# Patient Record
Sex: Female | Born: 1960 | Race: White | Hispanic: No | Marital: Married | Smoking: Current some day smoker
Health system: Southern US, Community
[De-identification: ages and names within clinical notes are randomized; demographics above are authoritative.]

## PROBLEM LIST (undated history)

## (undated) DIAGNOSIS — C801 Malignant (primary) neoplasm, unspecified: Secondary | ICD-10-CM

## (undated) DIAGNOSIS — I10 Essential (primary) hypertension: Secondary | ICD-10-CM

## (undated) HISTORY — PX: LOBECTOMY: SHX5089

---

## 2004-10-04 ENCOUNTER — Ambulatory Visit: Payer: Self-pay | Admitting: Physician Assistant

## 2004-11-01 ENCOUNTER — Ambulatory Visit: Payer: Self-pay | Admitting: Physician Assistant

## 2004-11-02 ENCOUNTER — Encounter: Payer: Self-pay | Admitting: Pain Medicine

## 2004-11-29 ENCOUNTER — Ambulatory Visit: Payer: Self-pay | Admitting: Physician Assistant

## 2005-01-01 ENCOUNTER — Ambulatory Visit: Payer: Self-pay | Admitting: Physician Assistant

## 2005-02-05 ENCOUNTER — Ambulatory Visit: Payer: Self-pay | Admitting: Physician Assistant

## 2005-03-05 ENCOUNTER — Ambulatory Visit: Payer: Self-pay | Admitting: Physician Assistant

## 2005-04-03 ENCOUNTER — Ambulatory Visit: Payer: Self-pay | Admitting: Physician Assistant

## 2005-05-01 ENCOUNTER — Ambulatory Visit: Payer: Self-pay | Admitting: Physician Assistant

## 2005-06-01 ENCOUNTER — Ambulatory Visit: Payer: Self-pay | Admitting: Physician Assistant

## 2005-06-29 ENCOUNTER — Ambulatory Visit: Payer: Self-pay | Admitting: Physician Assistant

## 2005-07-27 ENCOUNTER — Ambulatory Visit: Payer: Self-pay | Admitting: Physician Assistant

## 2005-08-14 ENCOUNTER — Ambulatory Visit: Payer: Self-pay | Admitting: Physician Assistant

## 2005-09-04 ENCOUNTER — Ambulatory Visit: Payer: Self-pay | Admitting: Physician Assistant

## 2005-10-03 ENCOUNTER — Ambulatory Visit: Payer: Self-pay | Admitting: Physician Assistant

## 2005-10-16 ENCOUNTER — Ambulatory Visit: Payer: Self-pay | Admitting: Physician Assistant

## 2005-10-23 ENCOUNTER — Ambulatory Visit: Payer: Self-pay | Admitting: Pain Medicine

## 2005-10-25 ENCOUNTER — Encounter: Payer: Self-pay | Admitting: Pain Medicine

## 2005-11-16 ENCOUNTER — Encounter: Payer: Self-pay | Admitting: Pain Medicine

## 2005-11-22 ENCOUNTER — Ambulatory Visit: Payer: Self-pay | Admitting: Physician Assistant

## 2005-12-31 ENCOUNTER — Ambulatory Visit: Payer: Self-pay | Admitting: Physician Assistant

## 2006-01-15 ENCOUNTER — Ambulatory Visit: Payer: Self-pay | Admitting: Pain Medicine

## 2006-01-29 ENCOUNTER — Ambulatory Visit: Payer: Self-pay | Admitting: Physician Assistant

## 2006-02-28 ENCOUNTER — Ambulatory Visit: Payer: Self-pay | Admitting: Physician Assistant

## 2006-03-26 ENCOUNTER — Ambulatory Visit: Payer: Self-pay | Admitting: Physician Assistant

## 2006-03-29 ENCOUNTER — Encounter: Admission: RE | Admit: 2006-03-29 | Discharge: 2006-03-29 | Payer: Self-pay | Admitting: Pain Medicine

## 2006-05-01 ENCOUNTER — Ambulatory Visit: Payer: Self-pay | Admitting: Physician Assistant

## 2006-05-29 ENCOUNTER — Ambulatory Visit: Payer: Self-pay | Admitting: Physician Assistant

## 2006-07-03 ENCOUNTER — Ambulatory Visit: Payer: Self-pay | Admitting: Physician Assistant

## 2006-07-16 ENCOUNTER — Ambulatory Visit: Payer: Self-pay | Admitting: Pain Medicine

## 2006-07-30 ENCOUNTER — Ambulatory Visit: Payer: Self-pay | Admitting: Physician Assistant

## 2006-08-27 ENCOUNTER — Ambulatory Visit: Payer: Self-pay | Admitting: Physician Assistant

## 2006-09-24 ENCOUNTER — Ambulatory Visit: Payer: Self-pay | Admitting: Physician Assistant

## 2006-10-24 ENCOUNTER — Ambulatory Visit: Payer: Self-pay | Admitting: Physician Assistant

## 2006-11-28 ENCOUNTER — Ambulatory Visit: Payer: Self-pay | Admitting: Physician Assistant

## 2006-12-26 ENCOUNTER — Ambulatory Visit: Payer: Self-pay | Admitting: Physician Assistant

## 2006-12-27 ENCOUNTER — Ambulatory Visit: Payer: Self-pay | Admitting: Internal Medicine

## 2007-01-28 ENCOUNTER — Ambulatory Visit: Payer: Self-pay | Admitting: Physician Assistant

## 2007-02-26 ENCOUNTER — Ambulatory Visit: Payer: Self-pay | Admitting: Physician Assistant

## 2007-03-27 ENCOUNTER — Ambulatory Visit: Payer: Self-pay | Admitting: Physician Assistant

## 2007-05-01 ENCOUNTER — Ambulatory Visit: Payer: Self-pay | Admitting: Physician Assistant

## 2007-06-03 ENCOUNTER — Ambulatory Visit: Payer: Self-pay | Admitting: Physician Assistant

## 2007-07-01 ENCOUNTER — Ambulatory Visit: Payer: Self-pay | Admitting: Physician Assistant

## 2007-07-01 ENCOUNTER — Ambulatory Visit: Payer: Self-pay | Admitting: Internal Medicine

## 2007-07-29 ENCOUNTER — Ambulatory Visit: Payer: Self-pay | Admitting: Physician Assistant

## 2007-09-01 ENCOUNTER — Ambulatory Visit: Payer: Self-pay | Admitting: Physician Assistant

## 2007-10-01 ENCOUNTER — Ambulatory Visit: Payer: Self-pay | Admitting: Physician Assistant

## 2007-10-29 ENCOUNTER — Ambulatory Visit: Payer: Self-pay | Admitting: Physician Assistant

## 2008-02-03 ENCOUNTER — Ambulatory Visit: Payer: Self-pay | Admitting: Physician Assistant

## 2008-04-28 ENCOUNTER — Ambulatory Visit: Payer: Self-pay | Admitting: Physician Assistant

## 2008-06-02 ENCOUNTER — Ambulatory Visit: Payer: Self-pay | Admitting: Physician Assistant

## 2008-07-13 ENCOUNTER — Ambulatory Visit: Payer: Self-pay | Admitting: Unknown Physician Specialty

## 2008-08-31 ENCOUNTER — Ambulatory Visit: Payer: Self-pay | Admitting: Physician Assistant

## 2008-12-06 ENCOUNTER — Ambulatory Visit: Payer: Self-pay | Admitting: Physician Assistant

## 2009-02-23 ENCOUNTER — Ambulatory Visit: Payer: Self-pay | Admitting: Internal Medicine

## 2009-03-01 ENCOUNTER — Ambulatory Visit: Payer: Self-pay | Admitting: Physician Assistant

## 2009-06-02 ENCOUNTER — Ambulatory Visit: Payer: Self-pay | Admitting: Physician Assistant

## 2009-09-01 ENCOUNTER — Ambulatory Visit: Payer: Self-pay | Admitting: Physician Assistant

## 2009-12-06 ENCOUNTER — Ambulatory Visit: Payer: Self-pay | Admitting: Physician Assistant

## 2010-02-27 ENCOUNTER — Ambulatory Visit: Payer: Self-pay | Admitting: Pain Medicine

## 2010-10-05 ENCOUNTER — Ambulatory Visit: Payer: Self-pay | Admitting: Family Medicine

## 2010-10-17 ENCOUNTER — Ambulatory Visit: Payer: Self-pay | Admitting: Family Medicine

## 2011-03-28 ENCOUNTER — Ambulatory Visit: Payer: Self-pay | Admitting: Family Medicine

## 2013-05-22 ENCOUNTER — Ambulatory Visit: Payer: Self-pay | Admitting: Family Medicine

## 2014-05-18 ENCOUNTER — Encounter: Payer: Self-pay | Admitting: *Deleted

## 2014-06-03 ENCOUNTER — Ambulatory Visit: Payer: Self-pay | Admitting: Family Medicine

## 2014-06-07 ENCOUNTER — Ambulatory Visit: Payer: Self-pay | Admitting: General Surgery

## 2014-06-24 ENCOUNTER — Encounter: Payer: Self-pay | Admitting: *Deleted

## 2015-05-23 ENCOUNTER — Ambulatory Visit: Payer: BLUE CROSS/BLUE SHIELD

## 2015-05-23 ENCOUNTER — Ambulatory Visit (INDEPENDENT_AMBULATORY_CARE_PROVIDER_SITE_OTHER): Payer: BLUE CROSS/BLUE SHIELD | Admitting: Podiatry

## 2015-05-23 ENCOUNTER — Ambulatory Visit (INDEPENDENT_AMBULATORY_CARE_PROVIDER_SITE_OTHER): Payer: BLUE CROSS/BLUE SHIELD

## 2015-05-23 ENCOUNTER — Encounter: Payer: Self-pay | Admitting: Podiatry

## 2015-05-23 VITALS — BP 154/93 | HR 68 | Resp 16 | Ht 66.0 in | Wt 226.0 lb

## 2015-05-23 DIAGNOSIS — G629 Polyneuropathy, unspecified: Secondary | ICD-10-CM

## 2015-05-23 DIAGNOSIS — M79671 Pain in right foot: Secondary | ICD-10-CM

## 2015-05-23 DIAGNOSIS — M722 Plantar fascial fibromatosis: Secondary | ICD-10-CM

## 2015-05-23 DIAGNOSIS — M79672 Pain in left foot: Secondary | ICD-10-CM | POA: Diagnosis not present

## 2015-05-23 NOTE — Progress Notes (Signed)
   Subjective:    Patient ID: Michele Dodson, female    DOB: Oct 13, 1961, 54 y.o.   MRN: [id number] Left pt has knot in arch of foot , burning and discomfort. This has been going on for about 8 months. No treatment other than soaking.  For her right footvthere is  burning on the ball of her foot and shoot pains. This has been going on for about 8 months as well.  Pt is not diabetic.  She states that her vitamin D and B-12 are very low. She is currently on replacement therapy for that. She also relates back problems for which she has been seeing a Restaurant manager, fast food. HPI    Review of Systems  Constitutional: Positive for activity change, fatigue and unexpected weight change.       Sweating   Gastrointestinal: Positive for diarrhea.  Endocrine: Positive for heat intolerance.  Musculoskeletal: Positive for back pain.       Muscle pain   Hematological: Bruises/bleeds easily.       Swollen lymph nodes   All other systems reviewed and are negative.      Objective:   Physical Exam  : I have reviewed her past medical history medications allergies surgery social history and review of systems. Pulses remain intact bilateral. Neurologic sensorium is intact per Semmes-Weinstein monofilament. Tendon reflexes are intact bilateral. Muscle strength intrinsic and extrinsic are intact. Orthopedic evaluation was resulted was distal to the ankle for range of motion without crepitation. Cutaneous evaluation of straight supple well-hydrated cutis no erythema edema saline drainage or odor.  Plantar fibroma is noted to the left forefoot. It measures approximately 2 cm x 1 cm half a centimeter.       Assessment & Plan:   assessment: E pathic neuropathy.  Plantar fibromatosis left.   Plan: discussed etiology pathology conservative versus surgical therapies.  At this point she's going to continue all other therapies follow-up with a chest x-ray as well as her medications.

## 2015-06-15 ENCOUNTER — Ambulatory Visit
Admission: RE | Admit: 2015-06-15 | Discharge: 2015-06-15 | Disposition: A | Discharge status: Home / Self Care | Payer: BLUE CROSS/BLUE SHIELD | Source: Ambulatory Visit | Attending: Family Medicine | Admitting: Family Medicine

## 2015-06-15 ENCOUNTER — Other Ambulatory Visit: Payer: Self-pay | Admitting: Family Medicine

## 2015-06-15 DIAGNOSIS — R059 Cough, unspecified: Secondary | ICD-10-CM

## 2015-06-15 DIAGNOSIS — R05 Cough: Secondary | ICD-10-CM

## 2015-10-11 DIAGNOSIS — Z87898 Personal history of other specified conditions: Secondary | ICD-10-CM | POA: Insufficient documentation

## 2015-10-11 DIAGNOSIS — M545 Low back pain, unspecified: Secondary | ICD-10-CM | POA: Insufficient documentation

## 2015-10-11 DIAGNOSIS — G8929 Other chronic pain: Secondary | ICD-10-CM | POA: Insufficient documentation

## 2015-10-11 DIAGNOSIS — F1191 Opioid use, unspecified, in remission: Secondary | ICD-10-CM | POA: Insufficient documentation

## 2015-10-19 ENCOUNTER — Other Ambulatory Visit: Payer: Self-pay | Admitting: Family Medicine

## 2015-10-19 DIAGNOSIS — Z1231 Encounter for screening mammogram for malignant neoplasm of breast: Secondary | ICD-10-CM

## 2015-10-24 ENCOUNTER — Other Ambulatory Visit: Payer: Self-pay | Admitting: Family Medicine

## 2015-10-24 DIAGNOSIS — R221 Localized swelling, mass and lump, neck: Secondary | ICD-10-CM

## 2015-10-31 ENCOUNTER — Ambulatory Visit: Payer: BLUE CROSS/BLUE SHIELD

## 2015-11-01 ENCOUNTER — Ambulatory Visit: Payer: BLUE CROSS/BLUE SHIELD | Attending: Family Medicine

## 2016-01-03 IMAGING — CR DG CHEST 2V
1 series · 2 of 2 positions shown · non-contrast
Comparison: None.

CLINICAL DATA: Coughing and congestion.

EXAM:
CHEST  2 VIEW

[Series 1: dg chest 2 view · 0.14mm/px · 2 of 2 slices shown]
[im 1/2]
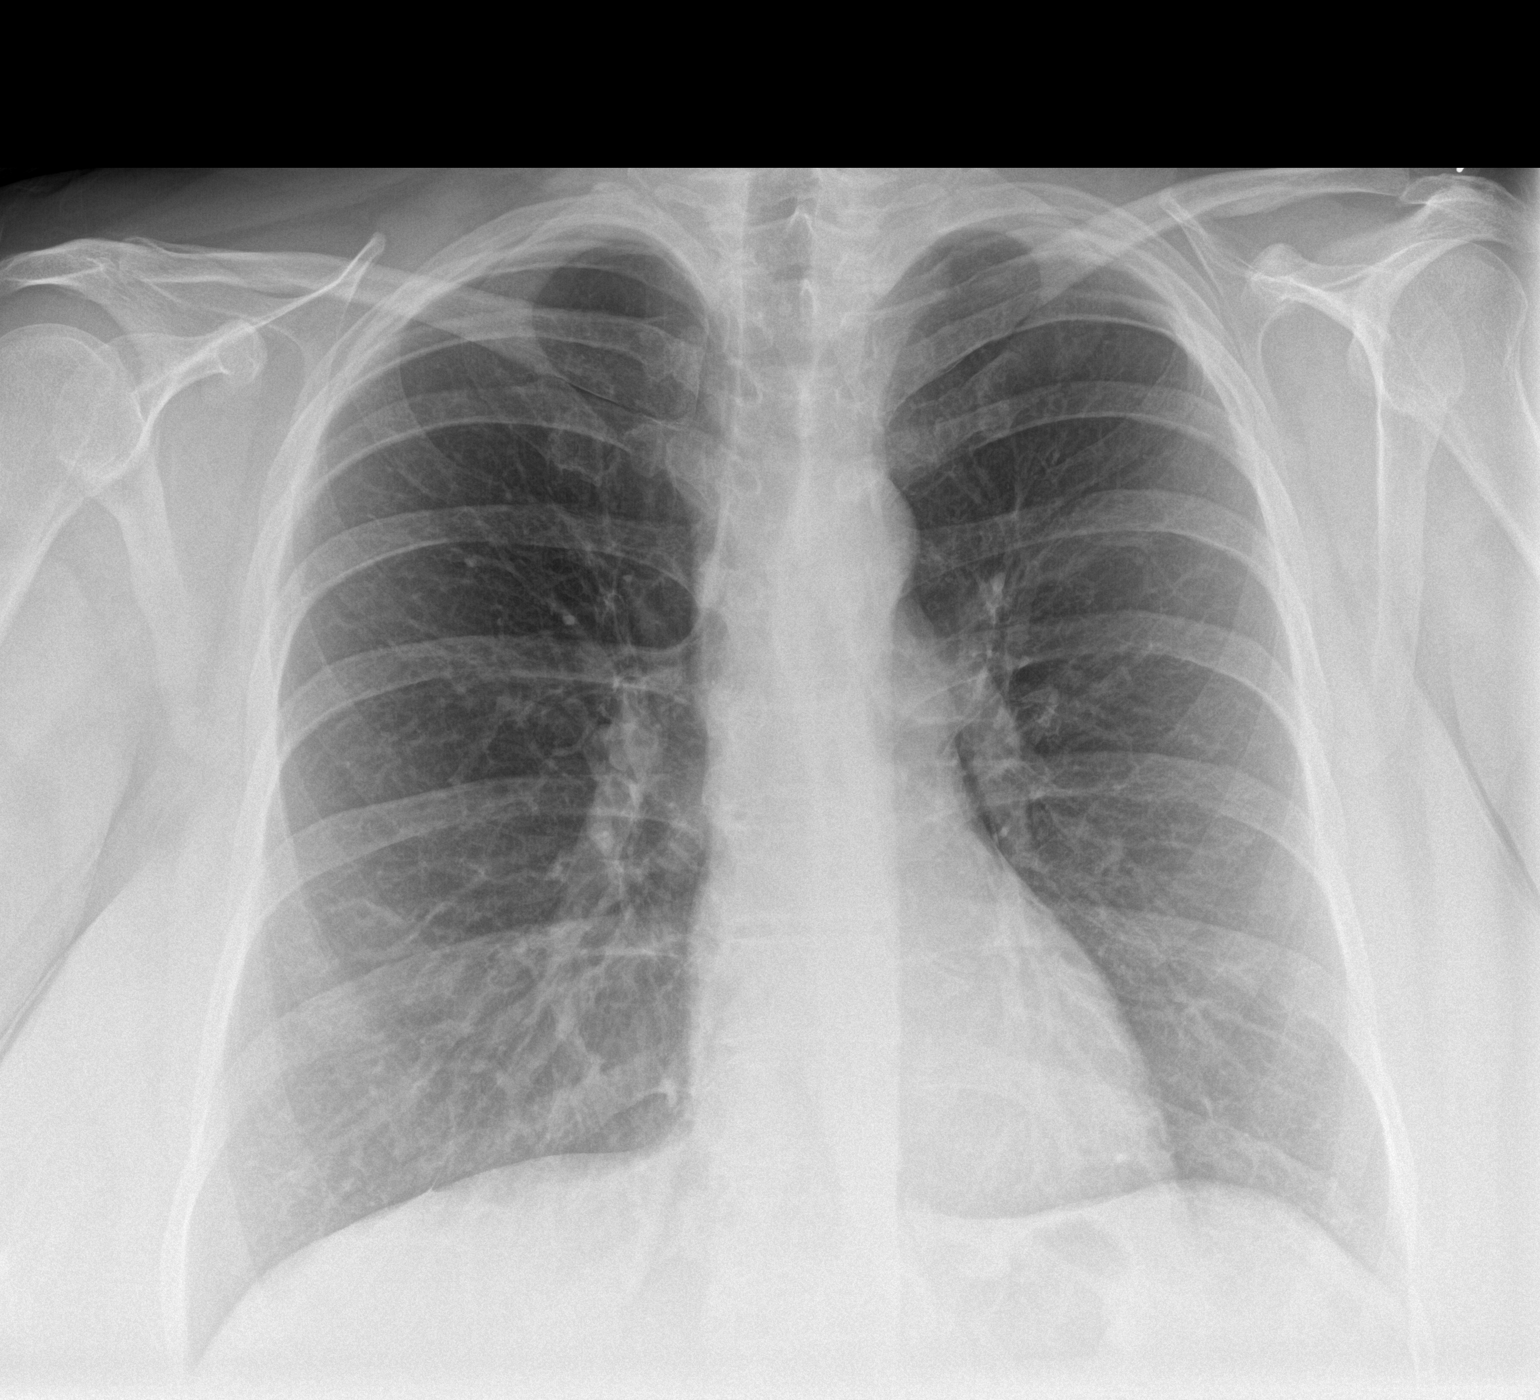
[im 2/2]
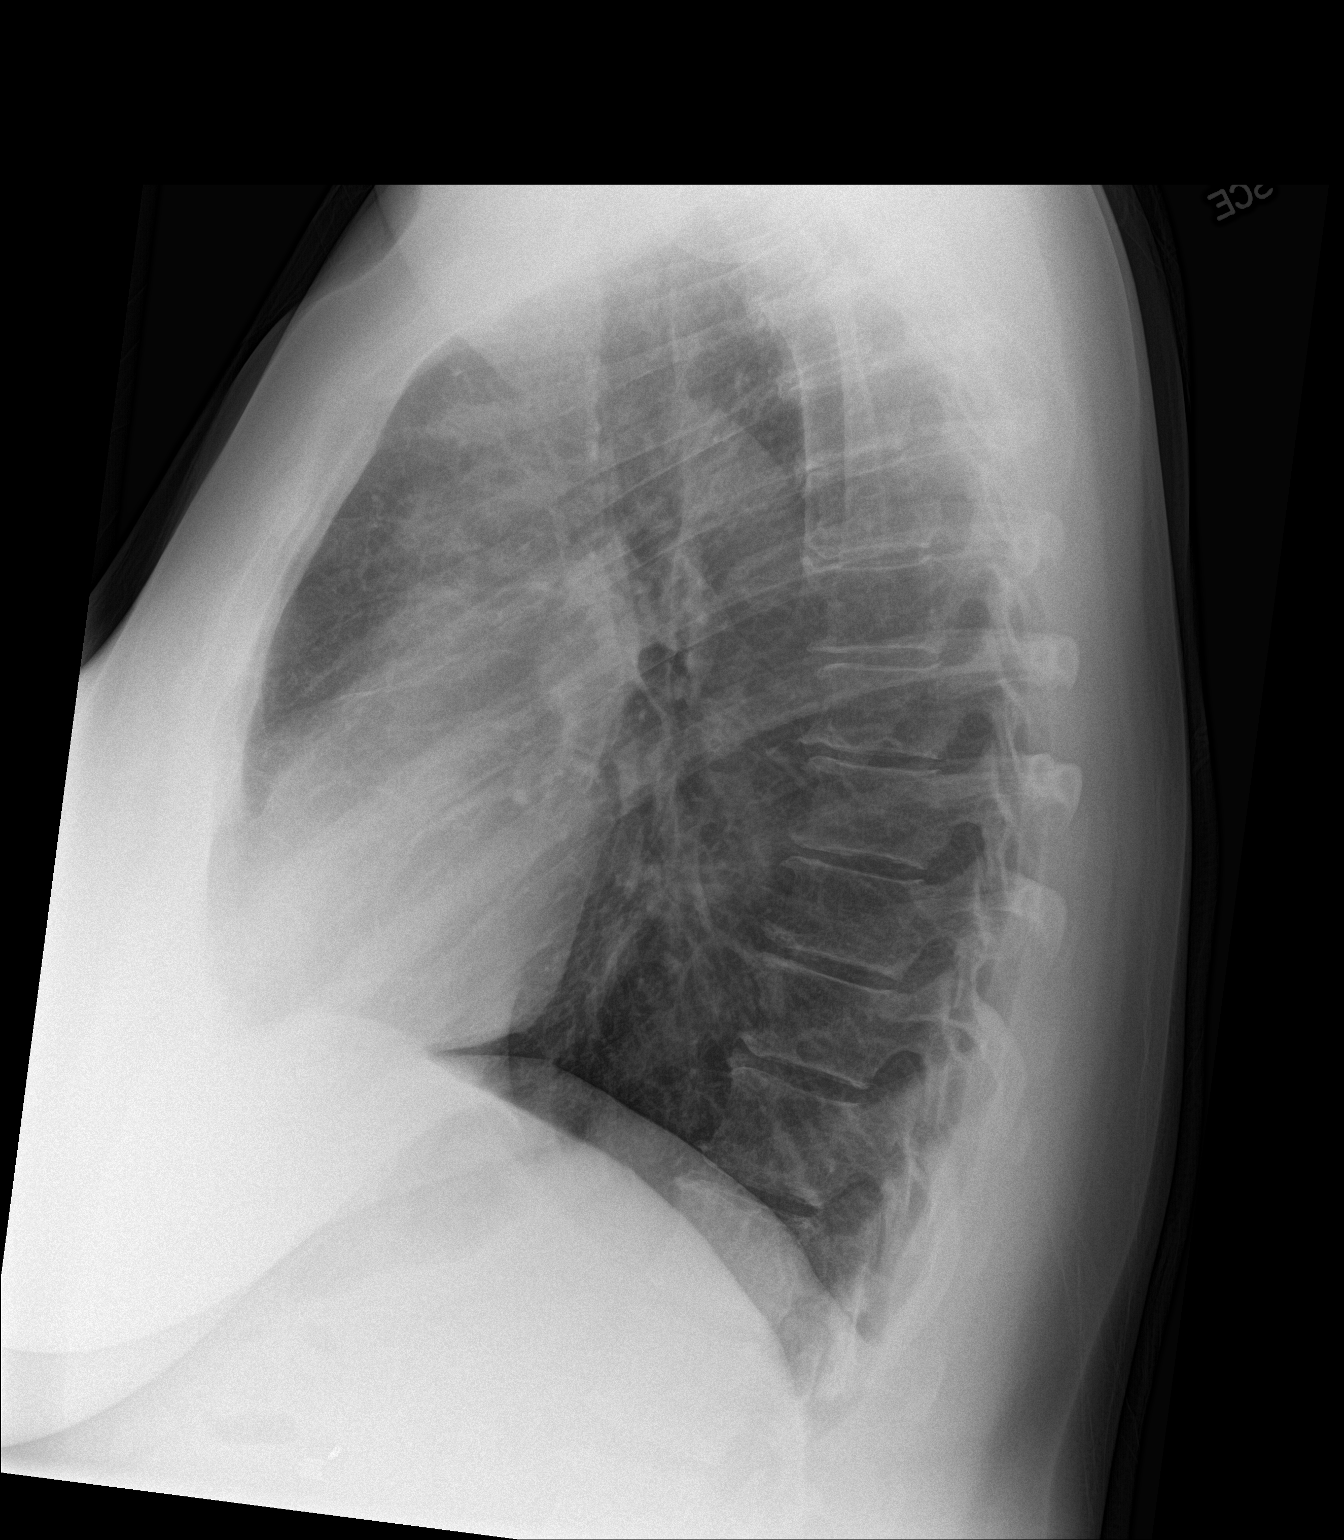

[2 of 2 positions shown; findings below may reference images not displayed]

FINDINGS: Both lungs are clear. Heart size is normal. Trachea is midline. Mild
degenerative endplate changes in the thoracic spine. No large
pleural effusions.
IMPRESSION: No active cardiopulmonary disease.

## 2016-02-03 ENCOUNTER — Institutional Professional Consult (permissible substitution): Payer: BLUE CROSS/BLUE SHIELD | Admitting: Internal Medicine

## 2016-09-03 ENCOUNTER — Ambulatory Visit: Payer: Self-pay | Admitting: Family Medicine

## 2016-09-20 DIAGNOSIS — K219 Gastro-esophageal reflux disease without esophagitis: Secondary | ICD-10-CM | POA: Insufficient documentation

## 2017-07-17 DIAGNOSIS — C801 Malignant (primary) neoplasm, unspecified: Secondary | ICD-10-CM

## 2017-07-17 HISTORY — DX: Malignant (primary) neoplasm, unspecified: C80.1

## 2017-07-25 DIAGNOSIS — Z6841 Body Mass Index (BMI) 40.0 and over, adult: Secondary | ICD-10-CM | POA: Insufficient documentation

## 2017-08-07 DIAGNOSIS — R0602 Shortness of breath: Secondary | ICD-10-CM | POA: Insufficient documentation

## 2017-08-26 DIAGNOSIS — C9110 Chronic lymphocytic leukemia of B-cell type not having achieved remission: Secondary | ICD-10-CM | POA: Insufficient documentation

## 2017-09-04 ENCOUNTER — Other Ambulatory Visit: Payer: Self-pay | Admitting: Family Medicine

## 2017-09-04 DIAGNOSIS — Z1231 Encounter for screening mammogram for malignant neoplasm of breast: Secondary | ICD-10-CM

## 2017-09-11 ENCOUNTER — Ambulatory Visit (INDEPENDENT_AMBULATORY_CARE_PROVIDER_SITE_OTHER): Payer: BLUE CROSS/BLUE SHIELD | Admitting: Podiatry

## 2017-09-11 ENCOUNTER — Ambulatory Visit: Payer: BLUE CROSS/BLUE SHIELD

## 2017-09-11 DIAGNOSIS — M7750 Other enthesopathy of unspecified foot: Secondary | ICD-10-CM

## 2017-09-11 DIAGNOSIS — I872 Venous insufficiency (chronic) (peripheral): Secondary | ICD-10-CM | POA: Diagnosis not present

## 2017-09-11 NOTE — Progress Notes (Signed)
She presents today with a chief complaint of bilateral ankle swelling and pain in the anterolateral ankle and heel. She reports that she's recently been diagnosed with leukemia and says that her ankle swell intermittently she states that it is a pulling pain in that she hasn't had any treatment for this.  Objective: Vital signs are stable alert and oriented 3. Pulses are strongly palpable capillary fill times immediate to toes. She has some pitting edema. She has pain on mediolateral compression.  assessment: venous insufficiency.  plan: i'm requesting venous workup from vascular surgery.

## 2017-09-12 ENCOUNTER — Telehealth: Payer: Self-pay

## 2017-09-12 NOTE — Telephone Encounter (Signed)
Patient notified via voice mail of appt scheduled at [facility] on Thurs. 10/17/17 @ 1pm.  She will be placed on cancellation list.  No precert required for bilat venous doppler per Melony Overly. With [employer]

## 2017-09-12 NOTE — Addendum Note (Signed)
Addended by: Graceann Congress D on: 09/12/2017 02:23 PM   Modules accepted: Orders

## 2017-09-17 ENCOUNTER — Ambulatory Visit
Admission: RE | Admit: 2017-09-17 | Discharge: 2017-09-17 | Disposition: A | Discharge status: Home / Self Care | Payer: BLUE CROSS/BLUE SHIELD | Source: Ambulatory Visit | Attending: Family Medicine | Admitting: Family Medicine

## 2017-09-17 DIAGNOSIS — Z1231 Encounter for screening mammogram for malignant neoplasm of breast: Secondary | ICD-10-CM | POA: Diagnosis present

## 2017-09-25 ENCOUNTER — Other Ambulatory Visit: Payer: Self-pay | Admitting: Family Medicine

## 2017-09-25 DIAGNOSIS — N6320 Unspecified lump in the left breast, unspecified quadrant: Secondary | ICD-10-CM

## 2017-09-25 DIAGNOSIS — R928 Other abnormal and inconclusive findings on diagnostic imaging of breast: Secondary | ICD-10-CM

## 2017-09-25 DIAGNOSIS — R599 Enlarged lymph nodes, unspecified: Secondary | ICD-10-CM

## 2017-09-30 ENCOUNTER — Ambulatory Visit
Admission: RE | Admit: 2017-09-30 | Discharge: 2017-09-30 | Disposition: A | Discharge status: Home / Self Care | Payer: BLUE CROSS/BLUE SHIELD | Source: Ambulatory Visit | Attending: Family Medicine | Admitting: Family Medicine

## 2017-09-30 DIAGNOSIS — N6320 Unspecified lump in the left breast, unspecified quadrant: Secondary | ICD-10-CM

## 2017-09-30 DIAGNOSIS — N6321 Unspecified lump in the left breast, upper outer quadrant: Secondary | ICD-10-CM | POA: Diagnosis not present

## 2017-09-30 DIAGNOSIS — R928 Other abnormal and inconclusive findings on diagnostic imaging of breast: Secondary | ICD-10-CM

## 2017-09-30 DIAGNOSIS — R599 Enlarged lymph nodes, unspecified: Secondary | ICD-10-CM

## 2017-10-17 ENCOUNTER — Encounter (INDEPENDENT_AMBULATORY_CARE_PROVIDER_SITE_OTHER): Payer: BLUE CROSS/BLUE SHIELD

## 2017-10-17 ENCOUNTER — Encounter (INDEPENDENT_AMBULATORY_CARE_PROVIDER_SITE_OTHER): Payer: BLUE CROSS/BLUE SHIELD | Admitting: Vascular Surgery

## 2017-11-21 ENCOUNTER — Encounter (INDEPENDENT_AMBULATORY_CARE_PROVIDER_SITE_OTHER): Payer: BLUE CROSS/BLUE SHIELD | Admitting: Vascular Surgery

## 2017-11-21 ENCOUNTER — Encounter (INDEPENDENT_AMBULATORY_CARE_PROVIDER_SITE_OTHER): Payer: BLUE CROSS/BLUE SHIELD

## 2018-02-28 ENCOUNTER — Other Ambulatory Visit: Payer: Self-pay | Admitting: Family Medicine

## 2018-02-28 DIAGNOSIS — R928 Other abnormal and inconclusive findings on diagnostic imaging of breast: Secondary | ICD-10-CM

## 2018-03-12 ENCOUNTER — Other Ambulatory Visit: Payer: BLUE CROSS/BLUE SHIELD

## 2018-03-26 ENCOUNTER — Other Ambulatory Visit: Payer: BLUE CROSS/BLUE SHIELD

## 2018-04-20 IMAGING — MG 2D DIGITAL DIAGNOSTIC BILATERAL MAMMOGRAM WITH CAD AND ADJUNCT T
6 of 9 series · 6 of 21 positions shown · non-contrast
Comparison: Previous exam(s).

CLINICAL DATA: 56-year-old female recalled from screening mammogram
dated 09/17/2017 for a left breast asymmetry and bilateral axillary
lymphadenopathy. Of note, the patient was diagnosed with leukemia in
Monday July, 2017.

EXAM:
2D DIGITAL DIAGNOSTIC LEFT MAMMOGRAM WITH CAD AND ADJUNCT TOMO
ULTRASOUND BILATERAL AXILLA

[L CC]
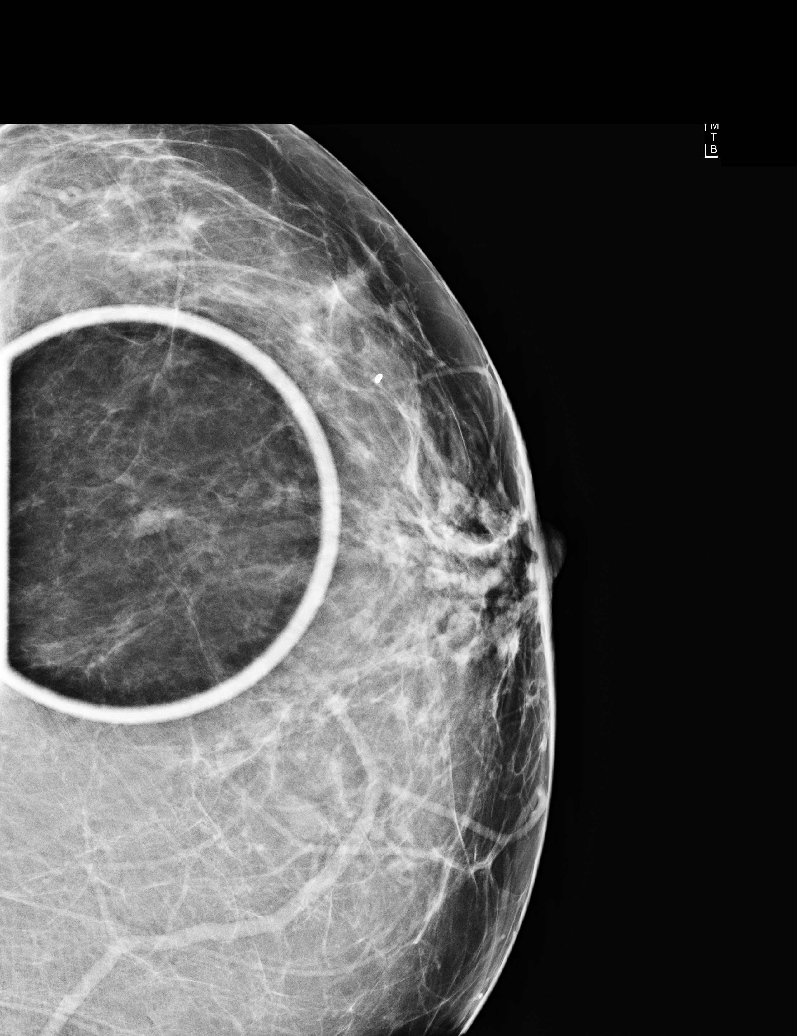

[L CC synth-2D]
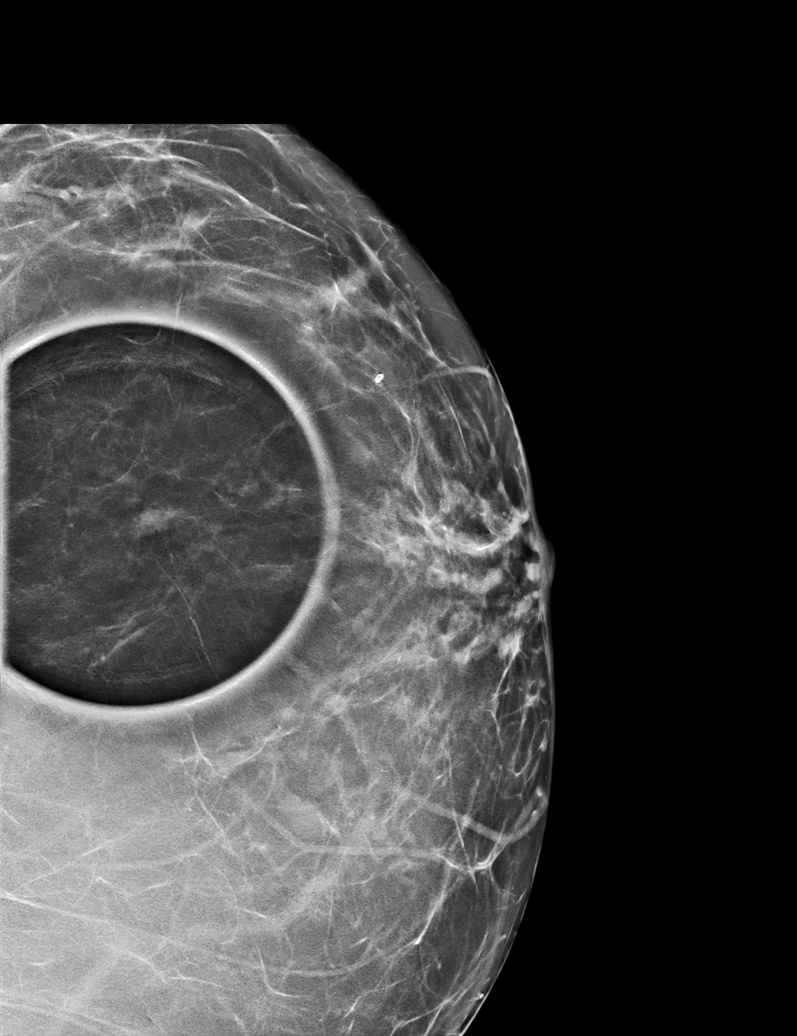

[L ML]
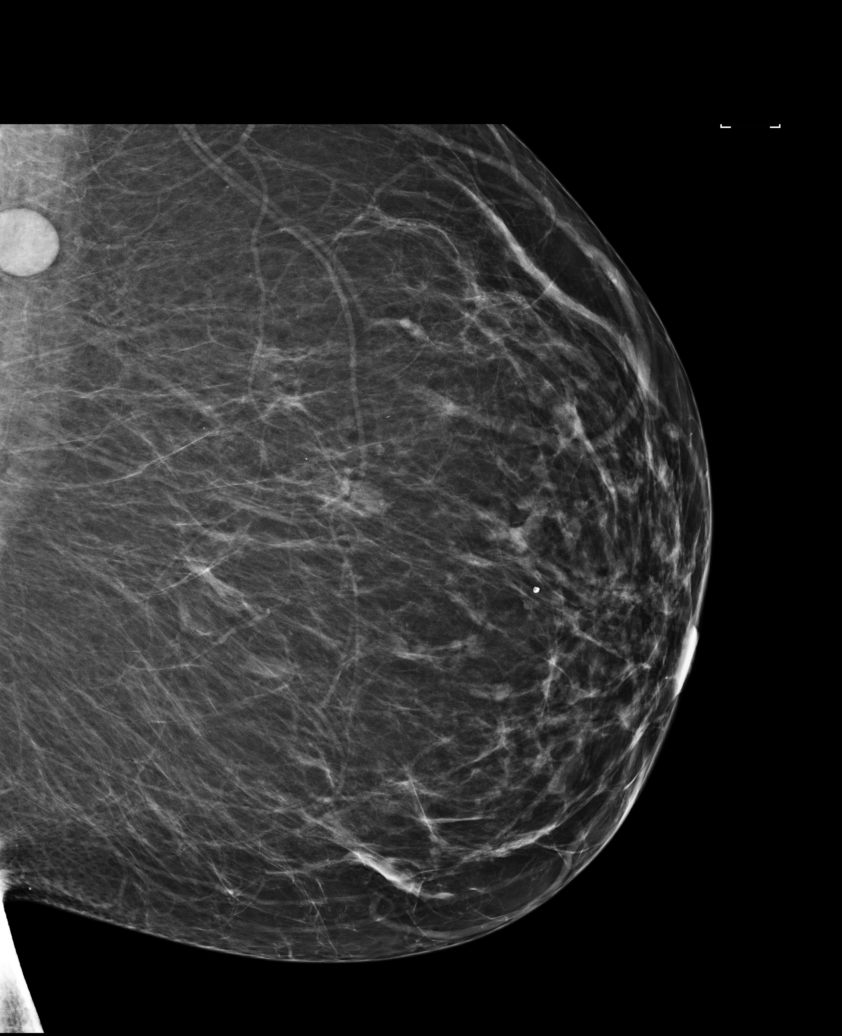

[L MLO synth-2D]
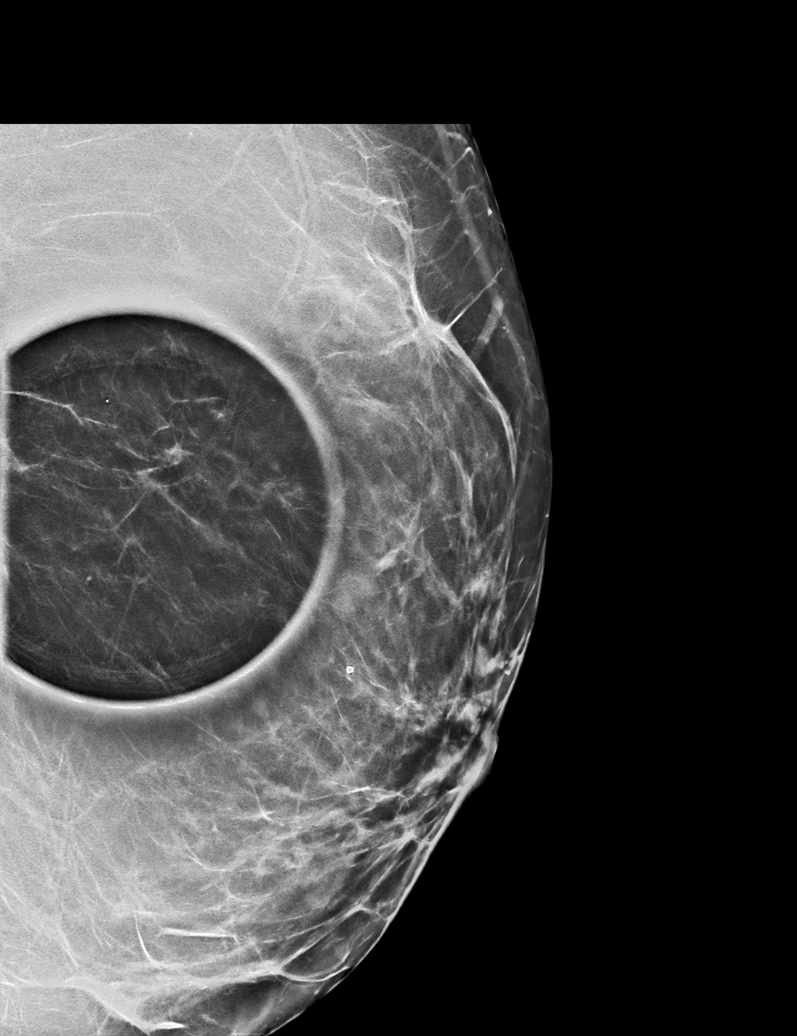

[L ML synth-2D]
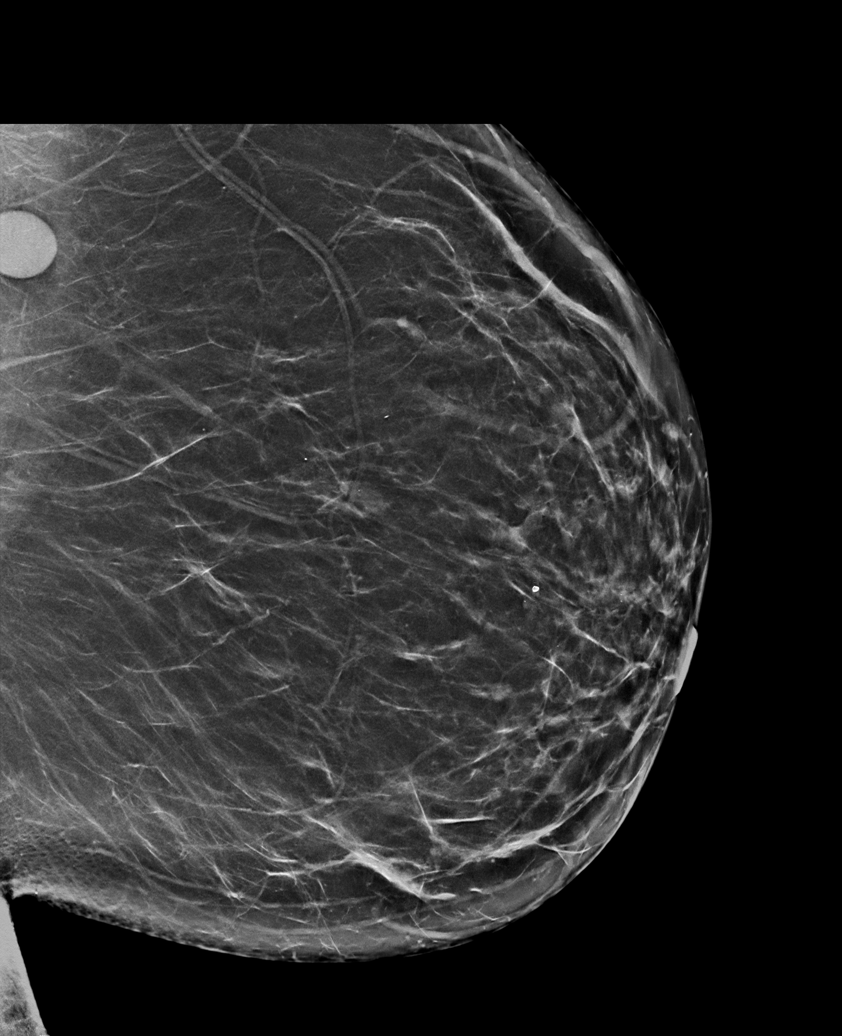

[L MLO]
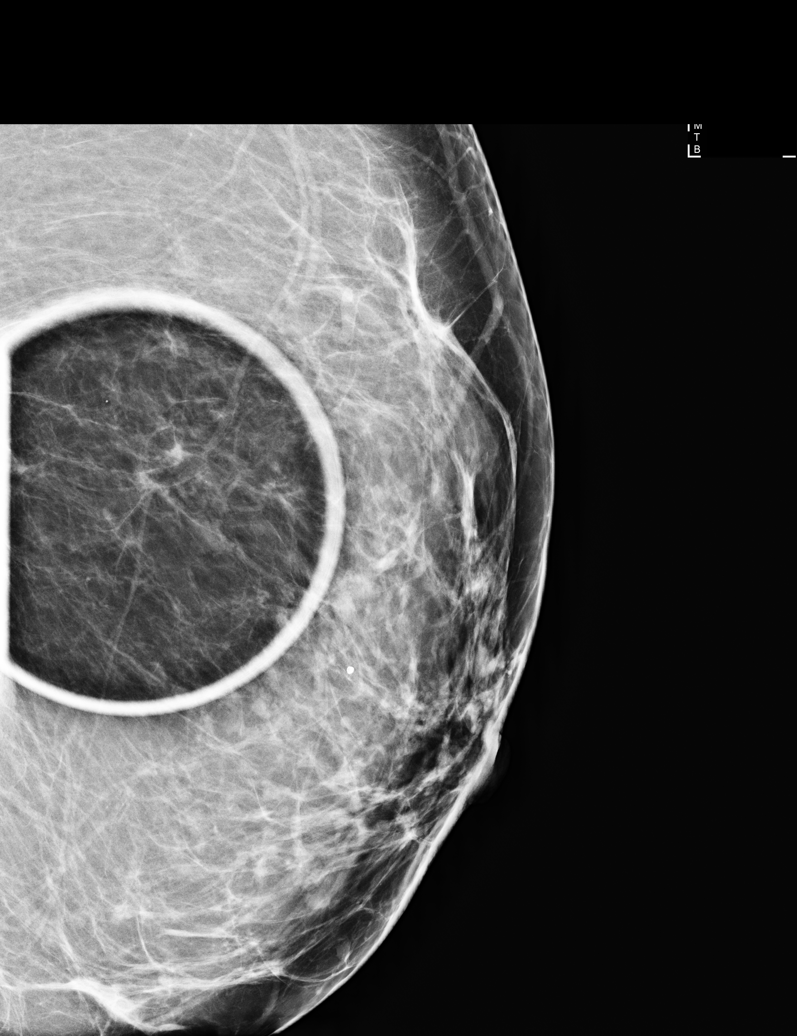

[6 of 21 positions shown; findings below may reference images not displayed]

ACR Breast Density Category b: There are scattered areas of
fibroglandular density.
FINDINGS: An asymmetry in the superior central left breast at middle depth
persists on today's additional views. It appears circumscribed with
lobulated borders and is equal in density. Further evaluation with
ultrasound was performed.

Mammographic images were processed with CAD.

Targeted ultrasound is performed, showing circumscribed lobulated
hypoechoic to anechoic masses at the 12 o'clock position 5 cm from
the nipple and 12 o'clock position 3 cm from the nipple on the left.
They measure 0.7 x 0.5 x 0.2 cm and 0.6 x 0.6 x 0.3 cm respectively.
There is no significant internal vascularity. These findings likely
represent complicated cysts versus cyst clusters. One of them likely
corresponds with the mammographic finding.

Additional evaluation of the bilateral axilla demonstrate multiple,
diffusely enlarged lymph nodes. They demonstrated 0.9 cm of cortical
thickening on the right and 0.8 cm of cortical thickening on the
left.
IMPRESSION: 1. Probably benign left breast masses x2, likely representing
complicated cysts versus cyst clusters. Recommendation is for
six-month mammographic and sonographic follow-up.
2. Bilateral axillary lymphadenopathy, suggesting a systemic process
and most likely related to the patient's recently diagnosed
leukemia. Further evaluation with biopsy can be performed as
clinically indicated.

RECOMMENDATION:
Diagnostic left breast mammogram and ultrasound in 6 months.

I have discussed the findings and recommendations with the patient.
Results were also provided in writing at the conclusion of the
visit. If applicable, a reminder letter will be sent to the patient
regarding the next appointment.

BI-RADS CATEGORY  3: Probably benign.

## 2018-05-09 ENCOUNTER — Ambulatory Visit
Admission: RE | Admit: 2018-05-09 | Discharge: 2018-05-09 | Disposition: A | Discharge status: Home / Self Care | Payer: BLUE CROSS/BLUE SHIELD | Source: Ambulatory Visit | Attending: Family Medicine | Admitting: Family Medicine

## 2018-05-09 DIAGNOSIS — R928 Other abnormal and inconclusive findings on diagnostic imaging of breast: Secondary | ICD-10-CM | POA: Insufficient documentation

## 2018-09-06 ENCOUNTER — Encounter: Payer: Self-pay | Admitting: Behavioral Health

## 2018-09-06 DIAGNOSIS — J302 Other seasonal allergic rhinitis: Secondary | ICD-10-CM | POA: Insufficient documentation

## 2018-09-06 DIAGNOSIS — R7302 Impaired glucose tolerance (oral): Secondary | ICD-10-CM | POA: Insufficient documentation

## 2018-09-26 ENCOUNTER — Ambulatory Visit (INDEPENDENT_AMBULATORY_CARE_PROVIDER_SITE_OTHER): Payer: BLUE CROSS/BLUE SHIELD | Admitting: Physician Assistant

## 2018-09-26 ENCOUNTER — Encounter: Payer: Self-pay | Admitting: Physician Assistant

## 2018-09-26 DIAGNOSIS — F331 Major depressive disorder, recurrent, moderate: Secondary | ICD-10-CM

## 2018-09-26 DIAGNOSIS — F411 Generalized anxiety disorder: Secondary | ICD-10-CM | POA: Diagnosis not present

## 2018-09-26 DIAGNOSIS — R5383 Other fatigue: Secondary | ICD-10-CM

## 2018-09-26 DIAGNOSIS — C9110 Chronic lymphocytic leukemia of B-cell type not having achieved remission: Secondary | ICD-10-CM | POA: Diagnosis not present

## 2018-09-26 DIAGNOSIS — F329 Major depressive disorder, single episode, unspecified: Secondary | ICD-10-CM | POA: Diagnosis not present

## 2018-09-26 DIAGNOSIS — F32A Depression, unspecified: Secondary | ICD-10-CM

## 2018-09-26 MED ORDER — SERTRALINE HCL 100 MG PO TABS: 200.0000 mg | ORAL_TABLET | Freq: Every day | ORAL | 1 refills | Status: DC

## 2018-09-26 MED ORDER — ARIPIPRAZOLE 5 MG PO TABS: 5.0000 mg | ORAL_TABLET | Freq: Every day | ORAL | 1 refills | Status: DC

## 2018-09-26 NOTE — Progress Notes (Signed)
[facility]  Patient ID: Michele Dodson,  MRN: [id number]  PCP: Serita Butcher, FNP  Date of Evaluation: 09/26/2018 Time spent:25 minutes   HISTORY/CURRENT STATUS: HPI admits he has had a lot of sadness since I saw her the last time 3 months ago.  Her dad died on 09-27-2023 of metastatic melanoma.  Although he was diagnosed a couple of years ago, his death happened kind of suddenly.  He had a fall, hitting his head and he died a couple of days later at home.  He had been on hospice for a while. Then her mother was unfortunately diagnosed with melanoma herself last week.  It is a stage III.  The plan is pending. She states she is tired all the time, which is not new, but the modafinil has not helped at all.  We had started that about 2 and half months ago and she still feels very tired, has no energy, does not want to do much of anything, and will fall asleep easily.  She does not feel any more depressed that she was tired to her father's death.  She denies any shortness of breath, chest pain, or dizziness.  She will see the oncologist within the next month, and follow-up for CLL.  She feels like she is having more lymph nodes in her neck and groin. She states that she talked to the pharmacist about the modafinil, telling her that it did not work and the pharmacist recommended she discuss using methylphenidate with me. Patient continues to have generalized anxiety.  She does have panic attacks that are triggered from the grief of the death of her dad.  She when she goes in her parents home, she states that empty chair where he always sat and she can start crying and having shortness of breath.  The panic attacks are relieved with Valium.  individual Medical History/ Review of Systems: Changes? :No   Allergies: Codeine  Current Medications:  Current Outpatient Medications:  .  amLODipine (NORVASC) 10 MG tablet, amlodipine 10 mg tablet  TAKE ONE TABLET BY MOUTH EVERY DAY,  Disp: , Rfl:  .  ARIPiprazole (ABILIFY) 5 MG tablet, Take 1 tablet (5 mg total) by mouth daily after breakfast., Disp: 30 tablet, Rfl: 1 .  buPROPion (WELLBUTRIN XL) 150 MG 24 hr tablet, Take 150 mg by mouth daily. Take 3 po Q Am, Disp: , Rfl:  .  diazepam (VALIUM) 5 MG tablet, Take 5 mg by mouth every 8 (eight) hours as needed for anxiety., Disp: , Rfl:  .  esomeprazole (NEXIUM) 40 MG capsule, Take 40 mg by mouth., Disp: , Rfl:  .  losartan (COZAAR) 100 MG tablet, losartan 100 mg tablet  TAKE ONE TABLET BY MOUTH EVERY DAY, Disp: , Rfl:  .  modafinil (PROVIGIL) 200 MG tablet, Take 200 mg by mouth daily as needed. 1/2 Q am prn, Disp: , Rfl:  .  sertraline (ZOLOFT) 100 MG tablet, Take 100 mg by mouth daily. Take 2 tablets daily, Disp: , Rfl:  .  sertraline (ZOLOFT) 100 MG tablet, Take 2 tablets (200 mg total) by mouth daily., Disp: 180 tablet, Rfl: 1 .  topiramate (TOPAMAX) 50 MG tablet, topiramate 50 mg tablet  Take 1 tablet (50 mg total) by mouth nightly., Disp: , Rfl:  .  traZODone (DESYREL) 100 MG tablet, trazodone 100 mg tablet  TAKE 1 OR 2 TABLETS AT BEDTIME AS NEEDED SLEEP, Disp: , Rfl:  Medication Side Effects: None  Family Medical/  Social History: Changes? Yes Dad died in 10-01-23 from Melanoma, Mom dx last week w/ Melanoma  MENTAL HEALTH EXAM:  There were no vitals taken for this visit.There is no height or weight on file to calculate BMI.  General Appearance: Well Groomed obese  Eye Contact:  Good  Speech:  Clear and Coherent  Volume:  Normal  Mood:  Depressed  Affect:  Depressed  Thought Process:  Goal Directed  Orientation:  Full (Time, Place, and Person)  Thought Content: Logical   Suicidal Thoughts:  No  Homicidal Thoughts:  No  Memory:  Immediate  Judgement:  Good  Insight:  Good  Psychomotor Activity:  Normal  Concentration:  fair  Recall:  Good  Fund of Knowledge: Good  Language: Good  Akathisia:  No  AIMS (if indicated): not done  Assets:  Desire for Improvement   ADL's:  Intact  Cognition: WNL  Prognosis:  Good    DIAGNOSES:    ICD-10-CM   1. Major depressive disorder, recurrent episode, moderate (HCC) F33.1   2. Generalized anxiety disorder F41.1   3. Fatigue due to depression F32.9    R53.83   4. 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      Chronic Low back pain 6.      Recent death of her father from metastatic melanoma   RECOMMENDATIONS: I spent 50% of 30-minute visit in counseling reference diagnosis and treatment options. My condolences concerning the loss of her father. Because she is on a benzodiazepine, and still very much needs that for anxiety, I am not going to prescribe strong stimulant.  She understands the reasoning of 1 began in upper and one being a downer. Add Abilify 5 mg 1 p.o. every morning, in order to increase her energy and motivation that is coming from the depression.  We discussed this at length and agree that a lot of her symptoms are multifactorial and when she sees the oncologist in the next few weeks will have more information as to whether the fatigue is coming from a physiologic cause as well as emotional and mental. Continue Zoloft, Wellbutrin, and Valium as noted above. DC modafinil. Return in 6 weeks or sooner as needed.  Donnal Moat, PA-C

## 2018-10-02 ENCOUNTER — Other Ambulatory Visit: Payer: Self-pay | Admitting: Family Medicine

## 2018-10-02 DIAGNOSIS — R928 Other abnormal and inconclusive findings on diagnostic imaging of breast: Secondary | ICD-10-CM

## 2018-10-02 DIAGNOSIS — Z1231 Encounter for screening mammogram for malignant neoplasm of breast: Secondary | ICD-10-CM

## 2018-10-28 ENCOUNTER — Ambulatory Visit
Admission: RE | Admit: 2018-10-28 | Discharge: 2018-10-28 | Disposition: A | Discharge status: Home / Self Care | Payer: BLUE CROSS/BLUE SHIELD | Source: Ambulatory Visit | Attending: Family Medicine | Admitting: Family Medicine

## 2018-10-28 DIAGNOSIS — R928 Other abnormal and inconclusive findings on diagnostic imaging of breast: Secondary | ICD-10-CM

## 2018-10-28 DIAGNOSIS — Z1231 Encounter for screening mammogram for malignant neoplasm of breast: Secondary | ICD-10-CM | POA: Diagnosis present

## 2018-10-28 HISTORY — DX: Malignant (primary) neoplasm, unspecified: C80.1

## 2018-11-07 ENCOUNTER — Ambulatory Visit (INDEPENDENT_AMBULATORY_CARE_PROVIDER_SITE_OTHER): Payer: BLUE CROSS/BLUE SHIELD | Admitting: Physician Assistant

## 2018-11-07 ENCOUNTER — Encounter: Payer: Self-pay | Admitting: Physician Assistant

## 2018-11-07 DIAGNOSIS — F411 Generalized anxiety disorder: Secondary | ICD-10-CM | POA: Diagnosis not present

## 2018-11-07 DIAGNOSIS — F331 Major depressive disorder, recurrent, moderate: Secondary | ICD-10-CM | POA: Diagnosis not present

## 2018-11-07 MED ORDER — SERTRALINE HCL 100 MG PO TABS: 250.0000 mg | ORAL_TABLET | Freq: Every day | ORAL | 1 refills | Status: DC

## 2018-11-07 NOTE — Progress Notes (Signed)
[facility]  Patient ID: Michele Dodson,  MRN: [id number]  PCP: Serita Butcher, FNP  Date of Evaluation: 11/07/2018 Time spent:15 minutes  Chief Complaint:  Chief Complaint    Follow-up      HISTORY/CURRENT STATUS: HPI  Here for 6 week med check.  We added Modafanil at the last visit.  States her mood is more irritable since starting it, plus it isn't helping. Wonders if we can increase it.  She's already tried up to 2 q am. And that helped. Marland Kitchen "I still feel foggy and not motivated. All I want to do is set and watch tv or sit on the couch and do nothing."  It started about the same time that her dad died 2 months ago. There's a lot of stuff going on with her family since then too. Things with her GM's house and her dtr living there, and not having a car, etc. "I just can't focus.  All this stuff races in my brain and I can't think about other stuff."   Not crying easily. She isolates "but I've always been a loner." Anxiety is controlled except for the racing thoughts.  The Valium helps.    Saw her Oncologist recently.  Has CLL.  They're still watching  Her counts and not treating it yet. "I feel pretty good physically.  I'm sleeping good."  Individual Medical History/ Review of Systems: Changes? :No   Allergies: Codeine  Current Medications:  Current Outpatient Medications:  .  amLODipine (NORVASC) 10 MG tablet, amlodipine 10 mg tablet  TAKE ONE TABLET BY MOUTH EVERY DAY, Disp: , Rfl:  .  ARIPiprazole (ABILIFY) 5 MG tablet, Take 1 tablet (5 mg total) by mouth daily after breakfast., Disp: 30 tablet, Rfl: 1 .  buPROPion (WELLBUTRIN XL) 150 MG 24 hr tablet, Take 150 mg by mouth daily. Take 3 po Q Am, Disp: , Rfl:  .  diazepam (VALIUM) 5 MG tablet, Take 5 mg by mouth every 8 (eight) hours as needed for anxiety., Disp: , Rfl:  .  esomeprazole (NEXIUM) 40 MG capsule, Take 40 mg by mouth., Disp: , Rfl:  .  losartan (COZAAR) 100 MG tablet, losartan 100 mg tablet  TAKE  ONE TABLET BY MOUTH EVERY DAY, Disp: , Rfl:  .  topiramate (TOPAMAX) 50 MG tablet, topiramate 50 mg tablet  Take 1 tablet (50 mg total) by mouth nightly., Disp: , Rfl:  .  traZODone (DESYREL) 100 MG tablet, trazodone 100 mg tablet  TAKE 1 OR 2 TABLETS AT BEDTIME AS NEEDED SLEEP, Disp: , Rfl:  Medication Side Effects: none  Family Medical/ Social History: Changes? No  MENTAL HEALTH EXAM:  There were no vitals taken for this visit.There is no height or weight on file to calculate BMI.  General Appearance: Casual and Obese  Eye Contact:  Good  Speech:  Clear and Coherent  Volume:  Normal  Mood:  Euthymic  Affect:  Appropriate  Thought Process:  Goal Directed  Orientation:  Full (Time, Place, and Person)  Thought Content: Logical   Suicidal Thoughts:  No  Homicidal Thoughts:  No  Memory:  WNL  Judgement:  Good  Insight:  Good  Psychomotor Activity:  Normal  Concentration:  Concentration: Fair and Attention Span: Fair  Recall:  [place] of Knowledge: Good  Language: Good  Assets:  Desire for Improvement  ADL's:  Intact  Cognition: WNL  Prognosis:  Good    DIAGNOSES:    ICD-10-CM   1.  Major depressive disorder, recurrent episode, moderate (HCC) F33.1   2. Generalized anxiety disorder F41.1   CLL, GERD, Obesity, SOB,Chronic back pain, Father died in 09/17/2023.   Receiving Psychotherapy: No    RECOMMENDATIONS: Increase Zoloft to 250mg  daily. Continue Wellbutrin and Valium. Stop the Modafanil. Strongly recommend she get in counseling. We discussed the grieving process in a part of what she is feeling is due to that, along with family stressors that often come after that in the family.  Medication will not help that part of her symptoms.  Hopefully though the Zoloft will help some of the brain fog and give her more mental clarity. Return in 6 weeks.   Donnal Moat, PA-C

## 2018-11-11 ENCOUNTER — Other Ambulatory Visit: Payer: Self-pay

## 2018-11-11 ENCOUNTER — Ambulatory Visit
Admission: EM | Admit: 2018-11-11 | Discharge: 2018-11-11 | Disposition: A | Discharge status: Home / Self Care | Payer: BLUE CROSS/BLUE SHIELD | Attending: Family Medicine | Admitting: Family Medicine

## 2018-11-11 DIAGNOSIS — B028 Zoster with other complications: Secondary | ICD-10-CM

## 2018-11-11 MED ORDER — VALACYCLOVIR HCL 1 G PO TABS: 1000.0000 mg | ORAL_TABLET | Freq: Three times a day (TID) | ORAL | 0 refills | Status: DC

## 2018-11-11 MED ORDER — OXYCODONE-ACETAMINOPHEN 5-325 MG PO TABS: ORAL_TABLET | ORAL | 0 refills | Status: DC

## 2018-11-11 NOTE — ED Triage Notes (Signed)
Patient complains of rash on abdomen, breast, neck and arms and face. Patient states that her body has been hurting and rash burns. Patient states that she saw NP at her work yesterday and was given Anti-virals yesterday and cream to place on area. Patient states that she has leukemia and was not sure if this was causing it. Patient states that all of her numbers were good recently, current not doing any treatment.

## 2018-11-11 NOTE — Discharge Instructions (Addendum)
Continue Valtrex (started yesterday) three times daily Call your hematologist/oncologist tomorrow morning Go to Emergency Department if symptoms worsen or are not improving

## 2018-11-17 ENCOUNTER — Other Ambulatory Visit: Payer: Self-pay

## 2018-11-17 MED ORDER — ARIPIPRAZOLE 5 MG PO TABS: 5.0000 mg | ORAL_TABLET | Freq: Every day | ORAL | 1 refills | Status: DC

## 2018-12-14 ENCOUNTER — Encounter: Payer: Self-pay | Admitting: Emergency Medicine

## 2018-12-14 DIAGNOSIS — F329 Major depressive disorder, single episode, unspecified: Secondary | ICD-10-CM | POA: Insufficient documentation

## 2018-12-14 DIAGNOSIS — F411 Generalized anxiety disorder: Secondary | ICD-10-CM

## 2018-12-24 ENCOUNTER — Encounter: Payer: Self-pay | Admitting: Physician Assistant

## 2018-12-24 ENCOUNTER — Ambulatory Visit (INDEPENDENT_AMBULATORY_CARE_PROVIDER_SITE_OTHER): Payer: BLUE CROSS/BLUE SHIELD | Admitting: Physician Assistant

## 2018-12-24 DIAGNOSIS — F411 Generalized anxiety disorder: Secondary | ICD-10-CM

## 2018-12-24 DIAGNOSIS — F330 Major depressive disorder, recurrent, mild: Secondary | ICD-10-CM | POA: Diagnosis not present

## 2018-12-24 DIAGNOSIS — G4700 Insomnia, unspecified: Secondary | ICD-10-CM

## 2018-12-24 MED ORDER — MODAFINIL 200 MG PO TABS: 200.0000 mg | ORAL_TABLET | Freq: Every day | ORAL | 1 refills | Status: DC

## 2018-12-24 NOTE — Progress Notes (Signed)
[facility]  Patient ID: Michele Dodson,  MRN: [id number]  PCP: Serita Butcher, FNP  Date of Evaluation: 12/24/2018 Time spent:15 minutes  Chief Complaint:  Chief Complaint    Depression      HISTORY/CURRENT STATUS: HPI For routine med check after increasing Zoloft.  Has noticed an improvement w/ increase Zoloft but only in physical sx.  No sweaty palms, palpitations.  Still gets anxious on the inside. Not able to sleep good.Trouble going to sleep and staying asleep. Crying easily.  Some anger outbursts sometimes, husband is an alcoholic and that's irritating.  This was first holiday w/o her dad who died last fall.  "There's a lot going on." No SI.   Forgets things and gets easily distracted. Would like to retry   Individual Medical History/ Review of Systems: Changes? :Yes Had shingles since LOV.  Better now.   Past medications for mental health diagnoses include: Prozac, Zoloft, Valium, Provigil, Buspar, Traz, Klonopin, Xanax, Zyban  Allergies: Codeine  Current Medications:  Current Outpatient Medications:  .  ARIPiprazole (ABILIFY) 5 MG tablet, Take 1 tablet (5 mg total) by mouth daily after breakfast., Disp: 30 tablet, Rfl: 1 .  buPROPion (WELLBUTRIN XL) 150 MG 24 hr tablet, Take 150 mg by mouth daily. Take 3 po Q Am, Disp: , Rfl:  .  diazepam (VALIUM) 5 MG tablet, Take 5 mg by mouth every 8 (eight) hours as needed for anxiety., Disp: , Rfl:  .  esomeprazole (NEXIUM) 40 MG capsule, Take 40 mg by mouth., Disp: , Rfl:  .  losartan (COZAAR) 100 MG tablet, losartan 100 mg tablet  TAKE ONE TABLET BY MOUTH EVERY DAY, Disp: , Rfl:  .  sertraline (ZOLOFT) 100 MG tablet, Take 2.5 tablets (250 mg total) by mouth daily., Disp: 225 tablet, Rfl: 1 Medication Side Effects: none  Family Medical/ Social History: Changes? No  MENTAL HEALTH EXAM:  There were no vitals taken for this visit.There is no height or weight on file to calculate BMI.  General Appearance:  Casual, Well Groomed and Obese  Eye Contact:  Good  Speech:  Clear and Coherent  Volume:  Normal  Mood:  Euthymic  Affect:  Appropriate  Thought Process:  Goal Directed  Orientation:  Full (Time, Place, and Person)  Thought Content: Logical   Suicidal Thoughts:  No  Homicidal Thoughts:  No  Memory:  WNL  Judgement:  Good  Insight:  Good  Psychomotor Activity:  Normal  Concentration:  Concentration: Fair and Attention Span: Fair  Recall:  Good  Fund of Knowledge: Good  Language: Good  Assets:  Desire for Improvement  ADL's:  Intact  Cognition: WNL  Prognosis:  Good    DIAGNOSES:    ICD-10-CM   1. GAD (generalized anxiety disorder) F41.1   2. Mild episode of recurrent major depressive disorder (HCC) F33.0   CLL no treatment at present, father died in the fall  Receiving Psychotherapy: No    RECOMMENDATIONS: Retry modafinil 200 mg 1 p.o. every morning.  I gave her information on [employer].  It is almost as cheap at [facility] so she will plan to go there that is where I sent the prescription and. Sleep hygiene discussed.  At this point, no new treatment. Continue Abilify 5 mg, Wellbutrin 450 mg Valium 5 mg 3 times daily as needed, Zoloft 250 mg daily. Return in 4 weeks.   Donnal Moat, PA-C

## 2019-01-02 ENCOUNTER — Other Ambulatory Visit: Payer: Self-pay

## 2019-01-02 MED ORDER — DIAZEPAM 5 MG PO TABS: 5.0000 mg | ORAL_TABLET | Freq: Three times a day (TID) | ORAL | 0 refills | Status: DC

## 2019-01-21 ENCOUNTER — Ambulatory Visit: Payer: BLUE CROSS/BLUE SHIELD | Admitting: Physician Assistant

## 2019-01-21 ENCOUNTER — Other Ambulatory Visit: Payer: Self-pay

## 2019-01-21 MED ORDER — ARIPIPRAZOLE 5 MG PO TABS: 5.0000 mg | ORAL_TABLET | Freq: Every day | ORAL | 0 refills | Status: DC

## 2019-01-21 MED ORDER — BUPROPION HCL ER (XL) 150 MG PO TB24: 450.0000 mg | ORAL_TABLET | Freq: Every day | ORAL | 0 refills | Status: DC

## 2019-02-16 ENCOUNTER — Encounter: Payer: Self-pay | Admitting: Physician Assistant

## 2019-02-16 ENCOUNTER — Ambulatory Visit (INDEPENDENT_AMBULATORY_CARE_PROVIDER_SITE_OTHER): Payer: BLUE CROSS/BLUE SHIELD | Admitting: Physician Assistant

## 2019-02-16 DIAGNOSIS — G4700 Insomnia, unspecified: Secondary | ICD-10-CM | POA: Diagnosis not present

## 2019-02-16 DIAGNOSIS — F330 Major depressive disorder, recurrent, mild: Secondary | ICD-10-CM

## 2019-02-16 DIAGNOSIS — F411 Generalized anxiety disorder: Secondary | ICD-10-CM

## 2019-02-16 MED ORDER — ARIPIPRAZOLE 5 MG PO TABS: 5.0000 mg | ORAL_TABLET | Freq: Every day | ORAL | 1 refills | Status: DC

## 2019-02-16 MED ORDER — MODAFINIL 200 MG PO TABS: 200.0000 mg | ORAL_TABLET | Freq: Every day | ORAL | 5 refills | Status: DC

## 2019-02-16 MED ORDER — DIAZEPAM 5 MG PO TABS: 5.0000 mg | ORAL_TABLET | Freq: Three times a day (TID) | ORAL | 5 refills | Status: DC | PRN

## 2019-02-16 MED ORDER — BUPROPION HCL ER (XL) 150 MG PO TB24: 450.0000 mg | ORAL_TABLET | Freq: Every day | ORAL | 1 refills | Status: DC

## 2019-02-16 NOTE — Progress Notes (Signed)
[facility]  Patient ID: Michele Dodson,  MRN: [id number]  PCP: Serita Butcher, FNP  Date of Evaluation: 02/16/2019 Time spent:15 minutes  Chief Complaint:  Chief Complaint    Follow-up      HISTORY/CURRENT STATUS: HPI Here for routine med check.  We added Modafinil back at [place].  She's done better with it this time. "It's all about the time I take it.  If I take it anywhere around 10:00 in the morning, then I feel better and can get more done during the day." No problems sleeping or other sx.   Patient denies loss of interest in usual activities and is able to enjoy things.  Denies decreased energy or motivation.  Appetite has not changed.  No extreme sadness for no reason, tearfulness, or feelings of hopelessness.  She is still grieving the death of her father who died last fall.  Denies any changes in concentration, making decisions or remembering things.  Denies suicidal or homicidal thoughts.  Anxiety is controlled.  Her Mother has been officially dx w/ dementia. Sometimes that's a trigger for the anxiety.  Her mom gets confused about little things and it's hard to see her like that.   Individual Medical History/ Review of Systems: Changes? :No  In a few weeks, she will see the doctor in follow-up for the leukemia.  States she feels pretty good and is not as tired as she was.  Past medications for mental health diagnoses include: Prozac, Zoloft, Valium, Provigil, Buspar, Traz, Klonopin, Xanax, Zyban  Allergies: Codeine   Current Medications:  Current Outpatient Medications:  .  ARIPiprazole (ABILIFY) 5 MG tablet, Take 1 tablet (5 mg total) by mouth daily after breakfast., Disp: 90 tablet, Rfl: 1 .  buPROPion (WELLBUTRIN XL) 150 MG 24 hr tablet, Take 3 tablets (450 mg total) by mouth daily. Take 3 po Q Am, Disp: 270 tablet, Rfl: 1 .  diazepam (VALIUM) 5 MG tablet, Take 1 tablet (5 mg total) by mouth every 8 (eight) hours as needed for anxiety., Disp: 90  tablet, Rfl: 5 .  esomeprazole (NEXIUM) 40 MG capsule, Take 40 mg by mouth 2 (two) times daily before a meal. , Disp: , Rfl:  .  ibuprofen (ADVIL,MOTRIN) 200 MG tablet, Take 200 mg by mouth every 6 (six) hours as needed., Disp: , Rfl:  .  losartan (COZAAR) 100 MG tablet, losartan 100 mg tablet  TAKE ONE TABLET BY MOUTH EVERY DAY, Disp: , Rfl:  .  modafinil (PROVIGIL) 200 MG tablet, Take 1 tablet (200 mg total) by mouth daily., Disp: 30 tablet, Rfl: 5 .  sertraline (ZOLOFT) 100 MG tablet, Take 2.5 tablets (250 mg total) by mouth daily., Disp: 225 tablet, Rfl: 1 Medication Side Effects: none  Family Medical/ Social History: Changes? No  MENTAL HEALTH EXAM:  There were no vitals taken for this visit.There is no height or weight on file to calculate BMI.  General Appearance: Casual, Well Groomed and Obese  Eye Contact:  Good  Speech:  Clear and Coherent  Volume:  Normal  Mood:  Euthymic  Affect:  Appropriate  Thought Process:  Goal Directed  Orientation:  Full (Time, Place, and Person)  Thought Content: Logical   Suicidal Thoughts:  No  Homicidal Thoughts:  No  Memory:  WNL  Judgement:  Good  Insight:  Good  Psychomotor Activity:  Normal  Concentration:  Concentration: Good and Attention Span: Good  Recall:  Good  Fund of Knowledge: Good  Language: Good  Assets:  Desire for Improvement  ADL's:  Intact  Cognition: WNL  Prognosis:  Good    DIAGNOSES:    ICD-10-CM   1. Mild episode of recurrent major depressive disorder ([place]) F33.0   2. GAD (generalized anxiety disorder) F41.1   3. Insomnia, unspecified type G47.00     Receiving Psychotherapy: No    RECOMMENDATIONS: Continue Abilify 5 mg p.o. every morning. Continue Wellbutrin XL 450 mg daily. Continue Valium 5 mg every 8 hours as needed anxiety. Continue modafinil 200 mg p.o. every morning as needed. Continue Zoloft 250 mg p.o. daily. Return in 6 months or sooner as needed.   Donnal Moat, PA-C   This record has  been created using [employer].  Chart creation errors have been sought, but may not always have been located and corrected. Such creation errors do not reflect on the standard of medical care.

## 2019-04-28 ENCOUNTER — Other Ambulatory Visit: Payer: Self-pay

## 2019-04-28 ENCOUNTER — Telehealth: Payer: Self-pay | Admitting: Physician Assistant

## 2019-04-28 MED ORDER — SERTRALINE HCL 100 MG PO TABS: 250.0000 mg | ORAL_TABLET | Freq: Every day | ORAL | 1 refills | Status: DC

## 2019-04-28 NOTE — Telephone Encounter (Signed)
Pt needs rx for sertraline sent to [facility].

## 2019-04-28 NOTE — Telephone Encounter (Signed)
Refill submitted. 

## 2019-05-28 ENCOUNTER — Other Ambulatory Visit: Payer: Self-pay

## 2019-05-28 ENCOUNTER — Ambulatory Visit
Admission: EM | Admit: 2019-05-28 | Discharge: 2019-05-28 | Disposition: A | Discharge status: Home / Self Care | Payer: BC Managed Care – PPO | Attending: Family Medicine | Admitting: Family Medicine

## 2019-05-28 DIAGNOSIS — H6502 Acute serous otitis media, left ear: Secondary | ICD-10-CM | POA: Diagnosis not present

## 2019-05-28 MED ORDER — AMOXICILLIN-POT CLAVULANATE 875-125 MG PO TABS: 1.0000 | ORAL_TABLET | Freq: Two times a day (BID) | ORAL | 0 refills | Status: DC

## 2019-05-28 MED ORDER — FLUCONAZOLE 150 MG PO TABS: 150.0000 mg | ORAL_TABLET | Freq: Once | ORAL | 0 refills | Status: AC

## 2019-05-28 NOTE — ED Triage Notes (Signed)
Patient complains of left ear pain that started yesterday.

## 2019-05-28 NOTE — ED Provider Notes (Signed)
[facility]    CSN: [id number] Arrival date & time: 05/28/19  0910  History   Chief Complaint Chief Complaint  Patient presents with  . Otalgia    left   HPI  58 year old female presents with left ear pain.  Left ear pain started abruptly yesterday around noon.  Has continued to persist.  Currently 4/10 in severity.  Denies fever.  Denies cough or shortness of breath.  No other respiratory symptoms.  No reported sick contacts.  No known exacerbating relieving factors.  No other associated symptoms.  No other complaints.  History reviewed and updated as below.  Past Medical History:  Diagnosis Date  . Cancer ([place]) 07/2017   leukemia (CLL) tx through [facility]   Patient Active Problem List   Diagnosis Date Noted  . MDD (major depressive disorder) 12/14/2018  . GAD (generalized anxiety disorder) 12/14/2018  . Glucose intolerance (impaired glucose tolerance) 09/06/2018  . Seasonal allergies 09/06/2018  . CLL (chronic lymphocytic leukemia) ([place]) 08/26/2017  . SOB (shortness of breath) 08/07/2017  . BMI 40.0-44.9, adult ([place]) 07/25/2017  . GERD (gastroesophageal reflux disease) 09/20/2016  . 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 Chronic low back pain without sciatica 10/11/2015   OB History   No obstetric history on file.    Home Medications    Prior to Admission medications   Medication Sig Start Date End Date Taking? Authorizing Provider  ARIPiprazole (ABILIFY) 5 MG tablet Take 1 tablet (5 mg total) by mouth daily after breakfast. 02/16/19  Yes Hurst, Teresa T, PA-C  buPROPion (WELLBUTRIN XL) 150 MG 24 hr tablet Take 3 tablets (450 mg total) by mouth daily. Take 3 po Q Am 02/16/19  Yes Hurst, Teresa T, PA-C  diazepam (VALIUM) 5 MG tablet Take 1 tablet (5 mg total) by mouth every 8 (eight) hours as needed for anxiety. 02/16/19  Yes Hurst, Teresa T, PA-C  esomeprazole (NEXIUM) 40 MG capsule Take 40 mg by mouth 2 (two) times daily before a meal.    Yes [provider]  etodolac (LODINE) 200 MG capsule Take 200 mg by mouth every 8 (eight) hours.   Yes [provider]  losartan (COZAAR) 100 MG tablet losartan 100 mg tablet  TAKE ONE TABLET BY MOUTH EVERY DAY   Yes [provider]  modafinil (PROVIGIL) 200 MG tablet Take 1 tablet (200 mg total) by mouth daily. 02/16/19  Yes Donnal Moat T, PA-C  sertraline (ZOLOFT) 100 MG tablet Take 2.5 tablets (250 mg total) by mouth daily. 04/28/19  Yes Donnal Moat T, PA-C  amoxicillin-clavulanate (AUGMENTIN) 875-125 MG tablet Take 1 tablet by mouth every 12 (twelve) hours. 05/28/19   Coral Spikes, DO  fluconazole (DIFLUCAN) 150 MG tablet Take 1 tablet (150 mg total) by mouth once for 1 dose. Repeat dose in 72 hours. 05/28/19 05/28/19  Coral Spikes, DO    Family History Family History  Problem Relation Age of Onset  . Melanoma Mother   . Breast cancer Mother   . Melanoma Father     Social History Social History   Tobacco Use  . Smoking status: Current Some Day Smoker    Packs/day: 0.00    Types: Cigarettes, E-cigarettes  . Smokeless tobacco: Never Used  . Tobacco comment: not a lot of vaping  Substance Use Topics  . Alcohol use: Never    Alcohol/week: 0.0 standard drinks    Frequency: Never  . Drug use: Never     Allergies   Codeine  Review of Systems Review of Systems  Constitutional: Negative for fever.  HENT: Positive for ear pain.   Respiratory: Negative.    Physical Exam Triage Vital Signs ED Triage Vitals  Enc Vitals Group     BP 05/28/19 0933 115/64     Pulse Rate 05/28/19 0933 60     Resp 05/28/19 0933 18     Temp 05/28/19 0933 97.8 F (36.6 C)     Temp Source 05/28/19 0933 Oral     SpO2 05/28/19 0933 99 %     Weight 05/28/19 0930 235 lb (106.6 kg)     Height 05/28/19 0930 5\' 6"  (1.676 m)     Head Circumference --      Peak Flow --      Pain Score 05/28/19 0930 4     Pain Loc --      Pain Edu? --      Excl. in [place]? --    Updated Vital Signs  BP 115/64 (BP Location: Right Arm)   Pulse 60   Temp 97.8 F (36.6 C) (Oral)   Resp 18   Ht 5\' 6"  (1.676 m)   Wt 106.6 kg   LMP  (LMP Unknown)   SpO2 99%   BMI 37.93 kg/m   Visual Acuity Right Eye Distance:   Left Eye Distance:   Bilateral Distance:    Right Eye Near:   Left Eye Near:    Bilateral Near:     Physical Exam Vitals signs and nursing note reviewed.  Constitutional:      General: She is not in acute distress.    Appearance: Normal appearance. She is obese.  HENT:     Head: Normocephalic and atraumatic.     Ears:     Comments: Left TM with erythema and effusion. Eyes:     General:        Right eye: No discharge.        Left eye: No discharge.     Conjunctiva/sclera: Conjunctivae normal.  Cardiovascular:     Rate and Rhythm: Normal rate and regular rhythm.  Pulmonary:     Effort: Pulmonary effort is normal.     Breath sounds: Normal breath sounds.  Neurological:     Mental Status: She is alert.  Psychiatric:        Mood and Affect: Mood normal.        Behavior: Behavior normal.    UC Treatments / Results  Labs (all labs ordered are listed, but only abnormal results are displayed) Labs Reviewed - No data to display  EKG None  Radiology No results found.  Procedures Procedures (including critical care time)  Medications Ordered in UC Medications - No data to display  Initial Impression / Assessment and Plan / UC Course  I have reviewed the triage vital signs and the nursing notes.  Pertinent labs & imaging results that were available during my care of the patient were reviewed by me and considered in my medical decision making (see chart for details).    58 year old female presents with serous otitis media.  Placing on Augmentin.  Diflucan if needed for yeast vaginitis.  Final Clinical Impressions(s) / UC Diagnoses   Final diagnoses:  Non-recurrent acute serous otitis media of left ear     Discharge Instructions     Antibiotic  as prescribed.  Diflucan if needed for yeast.  Take care  Dr. Lacinda Axon    ED Prescriptions    Medication Sig [place]. Provider  amoxicillin-clavulanate (AUGMENTIN) 875-125 MG tablet Take 1 tablet by mouth every 12 (twelve) hours. 14 tablet Mesiah Manzo G, DO   fluconazole (DIFLUCAN) 150 MG tablet Take 1 tablet (150 mg total) by mouth once for 1 dose. Repeat dose in 72 hours. 2 tablet Coral Spikes, DO     Controlled Substance Prescriptions [place] Controlled Substance Registry consulted? Not Applicable   Coral Spikes, [place] 05/28/19 [phone]

## 2019-05-28 NOTE — Discharge Instructions (Signed)
Antibiotic as prescribed.  Diflucan if needed for yeast.  Take care  Dr. Lacinda Axon

## 2019-08-19 ENCOUNTER — Other Ambulatory Visit: Payer: Self-pay

## 2019-08-19 ENCOUNTER — Encounter: Payer: Self-pay | Admitting: Physician Assistant

## 2019-08-19 ENCOUNTER — Ambulatory Visit (INDEPENDENT_AMBULATORY_CARE_PROVIDER_SITE_OTHER): Payer: BC Managed Care – PPO | Admitting: Physician Assistant

## 2019-08-19 DIAGNOSIS — F411 Generalized anxiety disorder: Secondary | ICD-10-CM

## 2019-08-19 DIAGNOSIS — F329 Major depressive disorder, single episode, unspecified: Secondary | ICD-10-CM

## 2019-08-19 DIAGNOSIS — F32A Depression, unspecified: Secondary | ICD-10-CM

## 2019-08-19 DIAGNOSIS — R5383 Other fatigue: Secondary | ICD-10-CM | POA: Diagnosis not present

## 2019-08-19 DIAGNOSIS — F331 Major depressive disorder, recurrent, moderate: Secondary | ICD-10-CM

## 2019-08-19 MED ORDER — ARIPIPRAZOLE 5 MG PO TABS: 5.0000 mg | ORAL_TABLET | Freq: Every day | ORAL | 1 refills | Status: DC

## 2019-08-19 MED ORDER — BUPROPION HCL ER (XL) 150 MG PO TB24: 450.0000 mg | ORAL_TABLET | Freq: Every day | ORAL | 1 refills | Status: DC

## 2019-08-19 MED ORDER — MODAFINIL 200 MG PO TABS: ORAL_TABLET | ORAL | 1 refills | Status: DC

## 2019-08-19 MED ORDER — DIAZEPAM 5 MG PO TABS: 5.0000 mg | ORAL_TABLET | Freq: Three times a day (TID) | ORAL | 5 refills | Status: DC | PRN

## 2019-08-19 NOTE — Progress Notes (Signed)
[facility]  Patient ID: Michele Dodson,  MRN: [id number]  PCP: Serita Butcher, FNP  Date of Evaluation: 08/19/2019 Time spent:15 minutes  Chief Complaint:  Chief Complaint    Depression; Follow-up     Virtual Visit via Telephone Note  I connected with patient by a video enabled telemedicine application or telephone, with their informed consent, and verified patient privacy and that I am speaking with the correct person using two identifiers.  I am private, in my home and the patient is in her car.   I discussed the limitations, risks, security and privacy concerns of performing an evaluation and management service by telephone and the availability of in person appointments. I also discussed with the patient that there may be a patient responsible charge related to this service. The patient expressed understanding and agreed to proceed.   I discussed the assessment and treatment plan with the patient. The patient was provided an opportunity to ask questions and all were answered. The patient agreed with the plan and demonstrated an understanding of the instructions.   The patient was advised to call back or seek an in-person evaluation if the symptoms worsen or if the condition fails to improve as anticipated.  I provided 15 minutes of non-face-to-face time during this encounter.  HISTORY/CURRENT STATUS: HPI For routine 6 month med check.  Having some trouble w/ her Mom who has dementia. She calls pt '15 times a day,' forgets to take her meds, "I feel overwhelmed. I haven't been able to grieve my daddy's death b/c of taking care of her.  It's been almost a year since he died.  My patience is thin. Mama still drives but gets lost.  I'm going to talk to her dr at her next appt later this month.  It's just stressful." Has trouble enjoying things but doesn't have time to. Energy and motivation are low, but not different.  Asks if we can increase the Provigil to help. Has  trouble taking care of her household and her Mom's household.  Anxiety is still a problem.  Not needing the Valium tid everyday, but needs it pretty much daily. Sleeps good, unless her mom calls her.   Denies dizziness, syncope, seizures, numbness, tingling, tremor, tics, unsteady gait, slurred speech, confusion. Denies muscle or joint pain, stiffness, or dystonia.  Individual Medical History/ Review of Systems: Changes? :Yes  still watching the CLL.  No change or need for chemo right now.  Past medications for mental health diagnoses include: Prozac, Zoloft, Valium, Provigil, Buspar, Traz, Klonopin, Xanax, Zyban  Allergies: Codeine  Current Medications:  Current Outpatient Medications:  .  ARIPiprazole (ABILIFY) 5 MG tablet, Take 1 tablet (5 mg total) by mouth daily after breakfast., Disp: 90 tablet, Rfl: 1 .  buPROPion (WELLBUTRIN XL) 150 MG 24 hr tablet, Take 3 tablets (450 mg total) by mouth daily. Take 3 po Q Am, Disp: 270 tablet, Rfl: 1 .  diazepam (VALIUM) 5 MG tablet, Take 1 tablet (5 mg total) by mouth every 8 (eight) hours as needed for anxiety., Disp: 90 tablet, Rfl: 5 .  esomeprazole (NEXIUM) 40 MG capsule, Take 40 mg by mouth 2 (two) times daily before a meal. , Disp: , Rfl:  .  etodolac (LODINE) 200 MG capsule, Take 200 mg by mouth every 8 (eight) hours., Disp: , Rfl:  .  losartan (COZAAR) 100 MG tablet, losartan 100 mg tablet  TAKE ONE TABLET BY MOUTH EVERY DAY, Disp: , Rfl:  .  modafinil (  PROVIGIL) 200 MG tablet, 1 po qam, and 1 po at 1:00 pm prn., Disp: 60 tablet, Rfl: 1 .  sertraline (ZOLOFT) 100 MG tablet, Take 2.5 tablets (250 mg total) by mouth daily., Disp: 225 tablet, Rfl: 1 .  amoxicillin-clavulanate (AUGMENTIN) 875-125 MG tablet, Take 1 tablet by mouth every 12 (twelve) hours. (Patient not taking: Reported on 08/19/2019), Disp: 14 tablet, Rfl: 0 Medication Side Effects: none  Family Medical/ Social History: Changes? No  MENTAL HEALTH EXAM:  There were no vitals  taken for this visit.There is no height or weight on file to calculate BMI.  General Appearance: unable to assess  Eye Contact:  unable to assess  Speech:  Clear and Coherent  Volume:  Normal  Mood:  Euthymic  Affect:  unable to assess  Thought Process:  Goal Directed  Orientation:  Full (Time, Place, and Person)  Thought Content: Logical   Suicidal Thoughts:  No  Homicidal Thoughts:  No  Memory:  WNL  Judgement:  Good  Insight:  Good  Psychomotor Activity:  unable to assess  Concentration:  Concentration: Good  Recall:  Good  Fund of Knowledge: Good  Language: Good  Assets:  Desire for Improvement  ADL's:  Intact  Cognition: WNL  Prognosis:  Good    DIAGNOSES:    ICD-10-CM   1. Major depressive disorder, recurrent episode, moderate (HCC)  F33.1   2. Generalized anxiety disorder  F41.1   3. Fatigue due to depression  F32.9    R53.83     Receiving Psychotherapy: No    RECOMMENDATIONS:  We discussed the modafinil.  Generally increasing above 200 mg is not really effective.  However I am willing to try it with her.  She understands if is not effective then we will go back down to 200 mg daily. Increase modafinil 200 mg, 1 p.o. every morning and 1 p.o. at around 1 PM.  Warned not to take too close to bedtime or it may sleep. Continue Abilify 5 mg every morning. Continue Wellbutrin XL 150 mg, 3 p.o. daily. Continue Valium 5 mg 1 every 8 hours as needed. Continue Zoloft 100 mg, 2.5 pills daily. Recommend therapy.  Donnal Moat, PA-C   This record has been created using [employer].  Chart creation errors have been sought, but may not always have been located and corrected. Such creation errors do not reflect on the standard of medical care.

## 2019-08-21 ENCOUNTER — Other Ambulatory Visit: Payer: Self-pay | Admitting: Family Medicine

## 2019-08-21 DIAGNOSIS — R1010 Upper abdominal pain, unspecified: Secondary | ICD-10-CM

## 2019-08-27 ENCOUNTER — Other Ambulatory Visit: Payer: Self-pay

## 2019-08-27 ENCOUNTER — Ambulatory Visit
Admission: RE | Admit: 2019-08-27 | Discharge: 2019-08-27 | Disposition: A | Discharge status: Home / Self Care | Payer: BC Managed Care – PPO | Source: Ambulatory Visit | Attending: Family Medicine | Admitting: Family Medicine

## 2019-08-27 DIAGNOSIS — R1010 Upper abdominal pain, unspecified: Secondary | ICD-10-CM | POA: Insufficient documentation

## 2019-08-28 ENCOUNTER — Other Ambulatory Visit: Payer: Self-pay | Admitting: Family Medicine

## 2019-08-28 DIAGNOSIS — R599 Enlarged lymph nodes, unspecified: Secondary | ICD-10-CM

## 2019-08-28 DIAGNOSIS — R1010 Upper abdominal pain, unspecified: Secondary | ICD-10-CM

## 2019-09-02 ENCOUNTER — Ambulatory Visit
Admission: RE | Admit: 2019-09-02 | Discharge: 2019-09-02 | Disposition: A | Discharge status: Home / Self Care | Payer: BC Managed Care – PPO | Source: Ambulatory Visit | Attending: Family Medicine | Admitting: Family Medicine

## 2019-09-02 ENCOUNTER — Other Ambulatory Visit: Payer: Self-pay

## 2019-09-02 DIAGNOSIS — R599 Enlarged lymph nodes, unspecified: Secondary | ICD-10-CM | POA: Insufficient documentation

## 2019-09-02 DIAGNOSIS — R1010 Upper abdominal pain, unspecified: Secondary | ICD-10-CM | POA: Insufficient documentation

## 2019-09-02 HISTORY — DX: Essential (primary) hypertension: I10

## 2019-09-02 MED ORDER — IOHEXOL 300 MG/ML  SOLN
150.0000 mL | Freq: Once | INTRAMUSCULAR | Status: AC | PRN
  Administered 2019-09-02: 125 mL via INTRAVENOUS

## 2019-10-01 ENCOUNTER — Other Ambulatory Visit: Payer: Self-pay | Admitting: Physician Assistant

## 2019-10-07 ENCOUNTER — Other Ambulatory Visit: Payer: Self-pay

## 2019-10-07 ENCOUNTER — Encounter: Payer: Self-pay | Admitting: Physician Assistant

## 2019-10-07 ENCOUNTER — Ambulatory Visit (INDEPENDENT_AMBULATORY_CARE_PROVIDER_SITE_OTHER): Payer: BC Managed Care – PPO | Admitting: Physician Assistant

## 2019-10-07 DIAGNOSIS — F411 Generalized anxiety disorder: Secondary | ICD-10-CM

## 2019-10-07 DIAGNOSIS — F329 Major depressive disorder, single episode, unspecified: Secondary | ICD-10-CM

## 2019-10-07 DIAGNOSIS — F331 Major depressive disorder, recurrent, moderate: Secondary | ICD-10-CM

## 2019-10-07 DIAGNOSIS — F32A Depression, unspecified: Secondary | ICD-10-CM

## 2019-10-07 DIAGNOSIS — C9110 Chronic lymphocytic leukemia of B-cell type not having achieved remission: Secondary | ICD-10-CM

## 2019-10-07 DIAGNOSIS — R5383 Other fatigue: Secondary | ICD-10-CM

## 2019-10-07 MED ORDER — MODAFINIL 200 MG PO TABS: 200.0000 mg | ORAL_TABLET | Freq: Every day | ORAL | 1 refills | Status: DC

## 2019-10-07 MED ORDER — B COMPLEX VITAMINS PO CAPS: 1.0000 | ORAL_CAPSULE | Freq: Every day | ORAL | 1 refills | Status: AC

## 2019-10-07 MED ORDER — SERTRALINE HCL 100 MG PO TABS: 300.0000 mg | ORAL_TABLET | Freq: Every day | ORAL | 1 refills | Status: DC

## 2019-10-07 NOTE — Progress Notes (Signed)
[facility]  Patient ID: Michele Dodson,  MRN: [id number]  PCP: Serita Butcher, FNP  Date of Evaluation: 10/07/2019 Time spent:45 minutes  Chief Complaint:  Chief Complaint    Depression; Anxiety; Follow-up      HISTORY/CURRENT STATUS: HPI For routine med check.  "I feel very unfocused but overwhelmed at the same time.  I don't have any motivation at all. I'll start to do something and get distracted and move to something else. I zone out a lot, even if I'm sitting and talking, I'll forget what I was saying.  I've got mail stacked up and papers that I need to deal with, and I used to not be that way. I feel like I haven't had time to grieve my daddy, and now I'm losing my Mom through dementia."  Has been going on about 6 months.  Feels a little withdrawn. Gets angry at the situation. Can't remember things. "I feel like I've lost my bearings. I sleep fine, but sometimes I get up and go to the bathroom but I go back to sleep." Motivation is extremely low.  Not crying easily.  Gets irritable easily. No SI/HI.  Patient denies increased energy with decreased need for sleep, no increased talkativeness, no racing thoughts, no impulsivity or risky behaviors, no increased spending, no increased libido, no grandiosity.  Her WBC went up to about 20,000 in Sept (has CLL.) Not on any treatment yet. The Hematologist says she's on the "cusp" of needing treatment.  Pt doesn't want to do treatment right now b/c of the pandemic and if she has problems from the txment then there'd be no one to take care of her Mom. States recent labs for thyroid, B12 and Folate were wnl.  The Provigil afternoon dose isn't helping at all.  It does help in the morning for 3 to 4 hours.  She gets more energetic and more motivated.  Still does not get a lot done however.  No H/A, dizziness, cough or cold sx, no CP, palpitations  Or SOB. No GI or GU sx.  Denies dizziness, syncope, seizures, numbness,  tingling, tremor, tics, unsteady gait, slurred speech, confusion. Denies muscle or joint pain, stiffness, or dystonia.  Individual Medical History/ Review of Systems: Changes? :Yes see above.  Had a CT abd in past few months.  Has LN present, she doesn't remember how much, if any, they've changed.   Past medications for mental health diagnoses include: Prozac, Zoloft, Valium, Provigil, Buspar, Traz, Klonopin, Xanax, Zyban  Allergies: Codeine  Current Medications:  Current Outpatient Medications:  .  ARIPiprazole (ABILIFY) 5 MG tablet, Take 1 tablet (5 mg total) by mouth daily after breakfast., Disp: 90 tablet, Rfl: 1 .  buPROPion (WELLBUTRIN XL) 150 MG 24 hr tablet, Take 3 tablets (450 mg total) by mouth daily. Take 3 po Q Am, Disp: 270 tablet, Rfl: 1 .  calcium citrate-vitamin D (CITRACAL+D) 315-200 MG-UNIT tablet, Take 1 tablet by mouth 2 (two) times daily., Disp: , Rfl:  .  diazepam (VALIUM) 5 MG tablet, Take 1 tablet (5 mg total) by mouth every 8 (eight) hours as needed for anxiety., Disp: 90 tablet, Rfl: 5 .  ELDERBERRY PO, Take by mouth., Disp: , Rfl:  .  esomeprazole (NEXIUM) 40 MG capsule, Take 40 mg by mouth 2 (two) times daily before a meal. , Disp: , Rfl:  .  etodolac (LODINE) 200 MG capsule, Take 200 mg by mouth every 8 (eight) hours., Disp: , Rfl:  .  losartan (COZAAR) 100 MG tablet, losartan 100 mg tablet  TAKE ONE TABLET BY MOUTH EVERY DAY, Disp: , Rfl:  .  modafinil (PROVIGIL) 200 MG tablet, Take 1 tablet (200 mg total) by mouth daily., Disp: 30 tablet, Rfl: 1 .  Multiple Vitamin (MULTIVITAMIN) tablet, Take 1 tablet by mouth daily., Disp: , Rfl:  .  sertraline (ZOLOFT) 100 MG tablet, Take 3 tablets (300 mg total) by mouth daily., Disp: 225 tablet, Rfl: 1 .  zinc gluconate 50 MG tablet, Take 50 mg by mouth daily., Disp: , Rfl:  .  amoxicillin-clavulanate (AUGMENTIN) 875-125 MG tablet, Take 1 tablet by mouth every 12 (twelve) hours. (Patient not taking: Reported on 10/07/2019),  Disp: 14 tablet, Rfl: 0 .  b complex vitamins capsule, Take 1 capsule by mouth daily., Disp: 30 capsule, Rfl: 1 .  HYDROcodone-homatropine (HYCODAN) 5-1.5 MG/5ML syrup, TAKE 1 TEASPOONFUL BY MOUTH EVERY 8 HOURS AS NEEDED FOR COUGH, Disp: , Rfl:  Medication Side Effects: none  Family Medical/ Social History: Changes? No  MENTAL HEALTH EXAM:  There were no vitals taken for this visit.There is no height or weight on file to calculate BMI.  General Appearance: Casual, Neat, Well Groomed and Obese  Eye Contact:  Good  Speech:  Clear and Coherent  Volume:  Normal  Mood:  Euthymic  Affect:  Appropriate  Thought Process:  Goal Directed and Descriptions of Associations: Intact  Orientation:  Full (Time, Place, and Person)  Thought Content: Logical   Suicidal Thoughts:  No  Homicidal Thoughts:  No  Memory:  WNL  Judgement:  Good  Insight:  Good  Psychomotor Activity:  Normal  Concentration:  Concentration: Good and Attention Span: Good  Recall:  Good  Fund of Knowledge: Good  Language: Good  Assets:  Desire for Improvement  ADL's:  Intact  Cognition: WNL  Prognosis:  Good    DIAGNOSES:    ICD-10-CM   1. Fatigue due to depression  F32.9    R53.83   2. Major depressive disorder, recurrent episode, moderate (HCC)  F33.1   3. Generalized anxiety disorder  F41.1   4. CLL (chronic lymphocytic leukemia) (HCC)  C91.10     Receiving Psychotherapy: No    RECOMMENDATIONS:  I spent 45 minutes with her face-to-face and 50% of that time was in counseling concerning the diagnosis of depression, anxiety, attention issues, and how it relates to grief, chronic illness in herself with the CLL, chronic illness with dementia and her mom. Increase Zoloft to 300 mg daily. Continue Abilify 5 mg every morning. Start B complex daily. Continue Wellbutrin XL 450 mg daily. Continue Valium 5 mg 3 times daily as needed. Continue Provigil 200 mg 1 p.o. every morning only, discontinue the afternoon  dose. Strongly recommend psychotherapy.  She really needs to see a counselor to help her get through these life changes, with the death of her father last year and the diagnosis of dementia and her mom.  Also, her husband is an alcoholic so she has to deal with that as well.  She is under a lot of life stressors. Return in 6 weeks.  Donnal Moat, PA-C

## 2019-10-22 ENCOUNTER — Ambulatory Visit: Payer: BC Managed Care – PPO | Admitting: Physician Assistant

## 2019-11-18 ENCOUNTER — Encounter: Payer: Self-pay | Admitting: Physician Assistant

## 2019-11-18 ENCOUNTER — Other Ambulatory Visit: Payer: Self-pay

## 2019-11-18 ENCOUNTER — Ambulatory Visit (INDEPENDENT_AMBULATORY_CARE_PROVIDER_SITE_OTHER): Payer: BC Managed Care – PPO | Admitting: Physician Assistant

## 2019-11-18 DIAGNOSIS — F329 Major depressive disorder, single episode, unspecified: Secondary | ICD-10-CM | POA: Diagnosis not present

## 2019-11-18 DIAGNOSIS — F331 Major depressive disorder, recurrent, moderate: Secondary | ICD-10-CM | POA: Diagnosis not present

## 2019-11-18 DIAGNOSIS — C9110 Chronic lymphocytic leukemia of B-cell type not having achieved remission: Secondary | ICD-10-CM | POA: Diagnosis not present

## 2019-11-18 DIAGNOSIS — F32A Depression, unspecified: Secondary | ICD-10-CM

## 2019-11-18 DIAGNOSIS — F411 Generalized anxiety disorder: Secondary | ICD-10-CM | POA: Diagnosis not present

## 2019-11-18 DIAGNOSIS — R5383 Other fatigue: Secondary | ICD-10-CM

## 2019-11-18 NOTE — Progress Notes (Signed)
[facility]  Patient ID: Michele Dodson,  MRN: [id number]  PCP: Serita Butcher, FNP  Date of Evaluation: 11/18/2019 Time spent:15 minutes  Chief Complaint:  Chief Complaint    Follow-up     Virtual Visit via Telephone Note  I connected with patient by a video enabled telemedicine application or telephone, with their informed consent, and verified patient privacy and that I am speaking with the correct person using two identifiers.  I am private, in my office and the patient is home.  I discussed the limitations, risks, security and privacy concerns of performing an evaluation and management service by telephone and the availability of in person appointments. I also discussed with the patient that there may be a patient responsible charge related to this service. The patient expressed understanding and agreed to proceed.   I discussed the assessment and treatment plan with the patient. The patient was provided an opportunity to ask questions and all were answered. The patient agreed with the plan and demonstrated an understanding of the instructions.   The patient was advised to call back or seek an in-person evaluation if the symptoms worsen or if the condition fails to improve as anticipated.  I provided 15 minutes of non-face-to-face time during this encounter.  HISTORY/CURRENT STATUS: HPI For routine med check.  Her dtr became psychotic after taking Zoloft last week. Had postpartum depression after her last baby was born 2 1/2 years ago. And was put on Zoloft recently for chronic depression.  She ended up in ICU.  "They don't know what caused it." She's better now, and back in the psych unit.  Pt states she's about the same.  Feels like her meds are working pretty well. "under the circumstances, I'm doing okay." Keeping her grandkids right now while her dtr is in hospital. "I'm stressed but ok."   WBC has gone up some in past few months.  (CLL) Has appt w/ Onc  in Jan. Is tired all the time.  The Modafanil has helped a little bit. Doesn't feel hyper. Sleeps okay.   Anxiety is still an issue especially with everything she is going through.  The Valium does help.  "I feel overwhelmed with dealing with my mother's dementia and then my daughter's reaction to the Zoloft, keeping my grandkids, and my husband is an alcoholic.  But I am doing the best I can.  I cannot imagine how I would feel if I was not on any medications."  Denies dizziness, syncope, seizures, numbness, tingling, tremor, tics, unsteady gait, slurred speech, confusion. Denies muscle or joint pain, stiffness, or dystonia.  Individual Medical History/ Review of Systems: Changes? :No    Past medications for mental health diagnoses include: Prozac, Zoloft, Valium, Provigil, Buspar, Traz, Klonopin, Xanax, Zyban, Abilify, Wellbutrin, Provigil  Allergies: Codeine  Current Medications:  Current Outpatient Medications:  .  ARIPiprazole (ABILIFY) 5 MG tablet, Take 1 tablet (5 mg total) by mouth daily after breakfast., Disp: 90 tablet, Rfl: 1 .  b complex vitamins capsule, Take 1 capsule by mouth daily., Disp: 30 capsule, Rfl: 1 .  buPROPion (WELLBUTRIN XL) 150 MG 24 hr tablet, Take 3 tablets (450 mg total) by mouth daily. Take 3 po Q Am, Disp: 270 tablet, Rfl: 1 .  calcium citrate-vitamin D (CITRACAL+D) 315-200 MG-UNIT tablet, Take 1 tablet by mouth 2 (two) times daily., Disp: , Rfl:  .  diazepam (VALIUM) 5 MG tablet, Take 1 tablet (5 mg total) by mouth every 8 (eight) hours as  needed for anxiety., Disp: 90 tablet, Rfl: 5 .  ELDERBERRY PO, Take by mouth., Disp: , Rfl:  .  esomeprazole (NEXIUM) 40 MG capsule, Take 40 mg by mouth 2 (two) times daily before a meal. , Disp: , Rfl:  .  HYDROcodone-homatropine (HYCODAN) 5-1.5 MG/5ML syrup, TAKE 1 TEASPOONFUL BY MOUTH EVERY 8 HOURS AS NEEDED FOR COUGH, Disp: , Rfl:  .  losartan (COZAAR) 100 MG tablet, losartan 100 mg tablet  TAKE ONE TABLET BY MOUTH EVERY  DAY, Disp: , Rfl:  .  modafinil (PROVIGIL) 200 MG tablet, Take 1 tablet (200 mg total) by mouth daily., Disp: 30 tablet, Rfl: 1 .  Multiple Vitamin (MULTIVITAMIN) tablet, Take 1 tablet by mouth daily., Disp: , Rfl:  .  sertraline (ZOLOFT) 100 MG tablet, Take 3 tablets (300 mg total) by mouth daily., Disp: 225 tablet, Rfl: 1 .  zinc gluconate 50 MG tablet, Take 50 mg by mouth daily., Disp: , Rfl:  .  amoxicillin-clavulanate (AUGMENTIN) 875-125 MG tablet, Take 1 tablet by mouth every 12 (twelve) hours. (Patient not taking: Reported on 10/07/2019), Disp: 14 tablet, Rfl: 0 .  etodolac (LODINE) 200 MG capsule, Take 200 mg by mouth every 8 (eight) hours., Disp: , Rfl:  Medication Side Effects: none  Family Medical/ Social History: Changes? No  MENTAL HEALTH EXAM:  There were no vitals taken for this visit.There is no height or weight on file to calculate BMI.  General Appearance: uanble to assess  Eye Contact:  uanble to assess  Speech:  Clear and Coherent  Volume:  Normal  Mood:  Euthymic  Affect:  unable to assess  Thought Process:  Goal Directed and Descriptions of Associations: Intact  Orientation:  Full (Time, Place, and Person)  Thought Content: Logical   Suicidal Thoughts:  No  Homicidal Thoughts:  No  Memory:  WNL  Judgement:  Good  Insight:  Good  Psychomotor Activity:  unable to assess  Concentration:  Concentration: Good and Attention Span: Good  Recall:  Good  Fund of Knowledge: Good  Language: Good  Assets:  Desire for Improvement  ADL's:  Intact  Cognition: WNL  Prognosis:  Good    DIAGNOSES:    ICD-10-CM   1. Major depressive disorder, recurrent episode, moderate (HCC)  F33.1   2. Generalized anxiety disorder  F41.1   3. Fatigue due to depression  F32.9    R53.83   4. CLL (chronic lymphocytic leukemia) (HCC)  C91.10     Receiving Psychotherapy: No    RECOMMENDATIONS:  Continue Abilify 5 mg every morning. Continue Wellbutrin XL 150 mg, 3 p.o.  daily. Continue Valium 5 mg 3 times daily as needed. Continue modafinil 200 mg every morning. Continue Zoloft 300 mg daily. Recommend counseling. Return in 3 months.  Donnal Moat, PA-C

## 2020-01-15 ENCOUNTER — Telehealth: Payer: Self-pay | Admitting: Physician Assistant

## 2020-01-15 ENCOUNTER — Other Ambulatory Visit: Payer: Self-pay

## 2020-01-15 MED ORDER — MODAFINIL 200 MG PO TABS: 200.0000 mg | ORAL_TABLET | Freq: Every day | ORAL | 1 refills | Status: DC

## 2020-01-15 NOTE — Telephone Encounter (Signed)
Pharmacy called and said that Michele Dodson needs a refill on her provigil . Please send in new script

## 2020-01-15 NOTE — Telephone Encounter (Signed)
Last refill 11/02/2019 #30, 1 refill submitted Pended for Helene Kelp to approve Has apt 02/18/2020

## 2020-02-18 ENCOUNTER — Encounter: Payer: Self-pay | Admitting: Physician Assistant

## 2020-02-18 ENCOUNTER — Ambulatory Visit (INDEPENDENT_AMBULATORY_CARE_PROVIDER_SITE_OTHER): Payer: BC Managed Care – PPO | Admitting: Physician Assistant

## 2020-02-18 DIAGNOSIS — F3341 Major depressive disorder, recurrent, in partial remission: Secondary | ICD-10-CM | POA: Diagnosis not present

## 2020-02-18 DIAGNOSIS — F411 Generalized anxiety disorder: Secondary | ICD-10-CM

## 2020-02-18 DIAGNOSIS — R5383 Other fatigue: Secondary | ICD-10-CM

## 2020-02-18 DIAGNOSIS — G4700 Insomnia, unspecified: Secondary | ICD-10-CM

## 2020-02-18 MED ORDER — ARIPIPRAZOLE 5 MG PO TABS: 5.0000 mg | ORAL_TABLET | Freq: Every day | ORAL | 1 refills | Status: DC

## 2020-02-18 MED ORDER — MODAFINIL 200 MG PO TABS: 200.0000 mg | ORAL_TABLET | Freq: Every day | ORAL | 5 refills | Status: DC

## 2020-02-18 MED ORDER — SERTRALINE HCL 100 MG PO TABS: 300.0000 mg | ORAL_TABLET | Freq: Every day | ORAL | 1 refills | Status: DC

## 2020-02-18 MED ORDER — BUPROPION HCL ER (XL) 150 MG PO TB24: 450.0000 mg | ORAL_TABLET | Freq: Every day | ORAL | 1 refills | Status: DC

## 2020-02-18 MED ORDER — DIAZEPAM 5 MG PO TABS: 5.0000 mg | ORAL_TABLET | Freq: Three times a day (TID) | ORAL | 5 refills | Status: DC | PRN

## 2020-02-18 NOTE — Progress Notes (Signed)
[facility]  Patient ID: Michele Dodson,  MRN: [id number]  PCP: Serita Butcher, FNP  Date of Evaluation: 02/18/2020 Time spent:20 minutes  Chief Complaint:  Chief Complaint    Anxiety; Depression     Virtual Visit via Telephone Note  I connected with patient by a video enabled telemedicine application or telephone, with their informed consent, and verified patient privacy and that I am speaking with the correct person using two identifiers.  I am private, in my office and the patient is home.  I discussed the limitations, risks, security and privacy concerns of performing an evaluation and management service by telephone and the availability of in person appointments. I also discussed with the patient that there may be a patient responsible charge related to this service. The patient expressed understanding and agreed to proceed.   I discussed the assessment and treatment plan with the patient. The patient was provided an opportunity to ask questions and all were answered. The patient agreed with the plan and demonstrated an understanding of the instructions.   The patient was advised to call back or seek an in-person evaluation if the symptoms worsen or if the condition fails to improve as anticipated.  I provided 20 minutes of non-face-to-face time during this encounter.  HISTORY/CURRENT STATUS: HPI For routine med check.  Complains of fatigue. States her WBC is up to 22,000.  Oncologist says that is not related at all to the fatigue.  She's not anemic.  Asks to increase the Modafanil by adding another pill in the afternoon.  Her mother has  dementia.  She's still living alone. Pt checks on her often. They don't live too far away.  "I feel good for the most part. I don't cry easy like I did before going on the medicines." Energy and motivation are low, in the afternoon. Enjoys things.  Appetite is normal. Weight has slowly decreased over the past few years, but  not rapid. No SI/HI.  Sleeps good most of the time. Gets up and goes to the BR several times a night but able to go back to sleep.  Doesn't snore or wake up gasping for air.   Still gets anxious on a daily basis but Valium helps.   Denies dizziness, syncope, seizures, numbness, tingling, tremor, tics, unsteady gait, slurred speech, confusion. Denies muscle or joint pain, stiffness, or dystonia.  Individual Medical History/ Review of Systems: Changes? :No    Past medications for mental health diagnoses include: Prozac, Zoloft, Valium, Provigil, Buspar, Traz, Klonopin, Xanax, Zyban, Abilify, Wellbutrin, Provigil  Allergies: Codeine  Current Medications:  Current Outpatient Medications:  .  ARIPiprazole (ABILIFY) 5 MG tablet, Take 1 tablet (5 mg total) by mouth daily after breakfast., Disp: 90 tablet, Rfl: 1 .  b complex vitamins capsule, Take 1 capsule by mouth daily., Disp: 30 capsule, Rfl: 1 .  buPROPion (WELLBUTRIN XL) 150 MG 24 hr tablet, Take 3 tablets (450 mg total) by mouth daily. Take 3 po Q Am, Disp: 270 tablet, Rfl: 1 .  calcium citrate-vitamin D (CITRACAL+D) 315-200 MG-UNIT tablet, Take 1 tablet by mouth 2 (two) times daily., Disp: , Rfl:  .  diazepam (VALIUM) 5 MG tablet, Take 1 tablet (5 mg total) by mouth every 8 (eight) hours as needed for anxiety., Disp: 90 tablet, Rfl: 5 .  ELDERBERRY PO, Take by mouth., Disp: , Rfl:  .  esomeprazole (NEXIUM) 40 MG capsule, Take 40 mg by mouth 2 (two) times daily before a meal. , Disp: ,  Rfl:  .  HYDROcodone-homatropine (HYCODAN) 5-1.5 MG/5ML syrup, TAKE 1 TEASPOONFUL BY MOUTH EVERY 8 HOURS AS NEEDED FOR COUGH, Disp: , Rfl:  .  losartan (COZAAR) 100 MG tablet, losartan 100 mg tablet  TAKE ONE TABLET BY MOUTH EVERY DAY, Disp: , Rfl:  .  modafinil (PROVIGIL) 200 MG tablet, Take 1 tablet (200 mg total) by mouth daily., Disp: 30 tablet, Rfl: 5 .  Multiple Vitamin (MULTIVITAMIN) tablet, Take 1 tablet by mouth daily., Disp: , Rfl:  .  sertraline  (ZOLOFT) 100 MG tablet, Take 3 tablets (300 mg total) by mouth daily., Disp: 270 tablet, Rfl: 1 .  zinc gluconate 50 MG tablet, Take 50 mg by mouth daily., Disp: , Rfl:  .  amoxicillin-clavulanate (AUGMENTIN) 875-125 MG tablet, Take 1 tablet by mouth every 12 (twelve) hours. (Patient not taking: Reported on 10/07/2019), Disp: 14 tablet, Rfl: 0 .  etodolac (LODINE) 200 MG capsule, Take 200 mg by mouth every 8 (eight) hours., Disp: , Rfl:  Medication Side Effects: none  Family Medical/ Social History: Changes? No  MENTAL HEALTH EXAM:  There were no vitals taken for this visit.There is no height or weight on file to calculate BMI.  General Appearance: uanble to assess  Eye Contact:  uanble to assess  Speech:  Clear and Coherent  Volume:  Normal  Mood:  Euthymic  Affect:  unable to assess  Thought Process:  Goal Directed and Descriptions of Associations: Intact  Orientation:  Full (Time, Place, and Person)  Thought Content: Logical   Suicidal Thoughts:  No  Homicidal Thoughts:  No  Memory:  WNL  Judgement:  Good  Insight:  Good  Psychomotor Activity:  unable to assess  Concentration:  Concentration: Good and Attention Span: Good  Recall:  Good  Fund of Knowledge: Good  Language: Good  Assets:  Desire for Improvement  ADL's:  Intact  Cognition: WNL  Prognosis:  Good    DIAGNOSES:    ICD-10-CM   1. Recurrent major depressive disorder, in partial remission ([place])  F33.41   2. Generalized anxiety disorder  F41.1   3. Insomnia, unspecified type  G47.00   4. Fatigue, unspecified type  R53.83     Receiving Psychotherapy: No    RECOMMENDATIONS:  PDMP was reviewed. I spent 20 minutes with her. Continue Abilify 5 mg every morning. Continue Wellbutrin XL 150 mg, 3 p.o. daily. Continue Valium 5 mg 3 times daily as needed. Continue modafinil 200 mg every morning. She can take 1/2 in am and 1/2 at lunch. I don't recommend increasing it.  Usually, less is more, for her dx.  Continue  Zoloft 300 mg daily. Recommend counseling. Return in 3 months.  Donnal Moat, PA-C

## 2020-08-01 ENCOUNTER — Other Ambulatory Visit: Payer: Self-pay | Admitting: Physician Assistant

## 2020-08-02 NOTE — Telephone Encounter (Signed)
Due back in June, last visit March 2021

## 2020-09-02 ENCOUNTER — Other Ambulatory Visit: Payer: Self-pay | Admitting: Physician Assistant

## 2020-09-02 NOTE — Telephone Encounter (Signed)
Last apt 02/2020

## 2020-10-31 ENCOUNTER — Telehealth: Payer: Self-pay | Admitting: Physician Assistant

## 2020-10-31 ENCOUNTER — Encounter: Payer: Self-pay | Admitting: Physician Assistant

## 2020-10-31 ENCOUNTER — Telehealth (INDEPENDENT_AMBULATORY_CARE_PROVIDER_SITE_OTHER): Payer: BC Managed Care – PPO | Admitting: Physician Assistant

## 2020-10-31 DIAGNOSIS — C9110 Chronic lymphocytic leukemia of B-cell type not having achieved remission: Secondary | ICD-10-CM

## 2020-10-31 DIAGNOSIS — F411 Generalized anxiety disorder: Secondary | ICD-10-CM

## 2020-10-31 DIAGNOSIS — F3341 Major depressive disorder, recurrent, in partial remission: Secondary | ICD-10-CM | POA: Diagnosis not present

## 2020-10-31 DIAGNOSIS — G4700 Insomnia, unspecified: Secondary | ICD-10-CM | POA: Diagnosis not present

## 2020-10-31 DIAGNOSIS — R5383 Other fatigue: Secondary | ICD-10-CM | POA: Diagnosis not present

## 2020-10-31 MED ORDER — BUPROPION HCL ER (XL) 150 MG PO TB24: ORAL_TABLET | ORAL | 1 refills | Status: DC

## 2020-10-31 MED ORDER — SERTRALINE HCL 100 MG PO TABS: ORAL_TABLET | ORAL | 1 refills | Status: DC

## 2020-10-31 MED ORDER — ARIPIPRAZOLE 5 MG PO TABS: ORAL_TABLET | ORAL | 1 refills | Status: DC

## 2020-10-31 MED ORDER — DIAZEPAM 5 MG PO TABS: 5.0000 mg | ORAL_TABLET | Freq: Three times a day (TID) | ORAL | 5 refills | Status: DC | PRN

## 2020-10-31 MED ORDER — MODAFINIL 200 MG PO TABS: 200.0000 mg | ORAL_TABLET | Freq: Every day | ORAL | 5 refills | Status: DC

## 2020-10-31 NOTE — Telephone Encounter (Signed)
[name], rainford are scheduled for a virtual visit with your provider today.    Just as we do with appointments in the office, we must obtain your consent to participate.  Your consent will be active for this visit and any virtual visit you may have with one of our providers in the next 365 days.    If you have a MyChart account, I can also send a copy of this consent to you electronically.  All virtual visits are billed to your insurance company just like a traditional visit in the office.  As this is a virtual visit, video technology does not allow for your provider to perform a traditional examination.  This may limit your provider's ability to fully assess your condition.  If your provider identifies any concerns that need to be evaluated in person or the need to arrange testing such as labs, EKG, etc, we will make arrangements to do so.    Although advances in technology are sophisticated, we cannot ensure that it will always work on either your end or our end.  If the connection with a video visit is poor, we may have to switch to a telephone visit.  With either a video or telephone visit, we are not always able to ensure that we have a secure connection.   I need to obtain your verbal consent now.   Are you willing to proceed with your visit today?   Michele Dodson has provided verbal consent on 10/31/2020 for a virtual visit (video or telephone).   Donnal Moat, PA-C 10/31/2020  3:35 PM

## 2020-10-31 NOTE — Progress Notes (Signed)
[facility]  Patient ID: Michele Dodson,  MRN: [id number]  PCP: Michele Butcher, FNP  Date of Evaluation: 10/31/2020 Time spent:20 minutes  Chief Complaint:  Chief Complaint    Anxiety; Depression; Follow-up     Virtual Visit via Telehealth  I connected with patient by telephone, with their informed consent, and verified patient privacy and that I am speaking with the correct person using two identifiers.  I am private, in my office and the patient is at home.  I discussed the limitations, risks, security and privacy concerns of performing an evaluation and management service by video and the availability of in person appointments. I also discussed with the patient that there may be a patient responsible charge related to this service. The patient expressed understanding and agreed to proceed.   I discussed the assessment and treatment plan with the patient. The patient was provided an opportunity to ask questions and all were answered. The patient agreed with the plan and demonstrated an understanding of the instructions.   The patient was advised to call back or seek an in-person evaluation if the symptoms worsen or if the condition fails to improve as anticipated.  I provided 20  minutes of non-face-to-face time during this encounter.  HISTORY/CURRENT STATUS: For routine med check.  Under a lot of stress. Her mother has dementia and pt is taking care of her.  She had to take her license so now Michele Dodson has to drive her everywhere. She has no siblings to help her.   Complains of fatigue and some bone pain, especially of thighs.  States her WBC is up over 100,000 now. Will have to get a bone marrow sometime soon.  On no treatment now for CLL.  Her oncologist has been watching it.  "I feel good except I'm tired a lot.  Energy and motivation are pretty good. Appetite is normal.  She does not cry easily.  Personal hygiene is normal.  No SI/HI.  Still gets anxious on  a daily basis but Valium helps. Sleeps good most of the time. Gets up and goes to the BR several times a night but able to go back to sleep.    Denies dizziness, syncope, seizures, numbness, tingling, tremor, tics, unsteady gait, slurred speech, confusion. Denies muscle or joint pain, stiffness, or dystonia.  Individual Medical History/ Review of Systems: Changes? :No    Past medications for mental health diagnoses include: Prozac, Zoloft, Valium, Provigil, Buspar, Traz, Klonopin, Xanax, Zyban, Abilify, Wellbutrin, Provigil  Allergies: Codeine  Current Medications:  Current Outpatient Medications:    ARIPiprazole (ABILIFY) 5 MG tablet, TAKE ONE TABLET BY MOUTH EVERY MORNING AFTER BREAKFAST, Disp: 90 tablet, Rfl: 1   b complex vitamins capsule, Take 1 capsule by mouth daily., Disp: 30 capsule, Rfl: 1   buPROPion (WELLBUTRIN XL) 150 MG 24 hr tablet, TAKE THREE TABLETS BY MOUTH EVERY MORNING, Disp: 270 tablet, Rfl: 1   calcium citrate-vitamin D (CITRACAL+D) 315-200 MG-UNIT tablet, Take 1 tablet by mouth 2 (two) times daily., Disp: , Rfl:    diazepam (VALIUM) 5 MG tablet, Take 1 tablet (5 mg total) by mouth every 8 (eight) hours as needed. for anxiety, Disp: 90 tablet, Rfl: 5   ELDERBERRY PO, Take by mouth., Disp: , Rfl:    esomeprazole (NEXIUM) 40 MG capsule, Take 40 mg by mouth 2 (two) times daily before a meal. , Disp: , Rfl:    losartan (COZAAR) 100 MG tablet, losartan 100 mg tablet  TAKE ONE TABLET  BY MOUTH EVERY DAY, Disp: , Rfl:    modafinil (PROVIGIL) 200 MG tablet, Take 1 tablet (200 mg total) by mouth daily., Disp: 30 tablet, Rfl: 5   Multiple Vitamin (MULTIVITAMIN) tablet, Take 1 tablet by mouth daily., Disp: , Rfl:    sertraline (ZOLOFT) 100 MG tablet, TAKE THREE TABLETS BY MOUTH ONCE DAILY, Disp: 270 tablet, Rfl: 1   zinc gluconate 50 MG tablet, Take 50 mg by mouth daily., Disp: , Rfl:    amoxicillin-clavulanate (AUGMENTIN) 875-125 MG tablet, Take 1 tablet by mouth  every 12 (twelve) hours. (Patient not taking: Reported on 10/07/2019), Disp: 14 tablet, Rfl: 0   etodolac (LODINE) 200 MG capsule, Take 200 mg by mouth every 8 (eight) hours. (Patient not taking: Reported on 10/31/2020), Disp: , Rfl:    HYDROcodone-homatropine (HYCODAN) 5-1.5 MG/5ML syrup, TAKE 1 TEASPOONFUL BY MOUTH EVERY 8 HOURS AS NEEDED FOR COUGH (Patient not taking: Reported on 10/31/2020), Disp: , Rfl:  Medication Side Effects: none  Family Medical/ Social History: Changes? No  MENTAL HEALTH EXAM:  There were no vitals taken for this visit.There is no height or weight on file to calculate BMI.  General Appearance: uanble to assess  Eye Contact:  uanble to assess  Speech:  Clear and Coherent  Volume:  Normal  Mood:  Euthymic  Affect:  unable to assess  Thought Process:  Goal Directed and Descriptions of Associations: Intact  Orientation:  Full (Time, Place, and Person)  Thought Content: Logical   Suicidal Thoughts:  No  Homicidal Thoughts:  No  Memory:  WNL  Judgement:  Good  Insight:  Good  Psychomotor Activity:  unable to assess  Concentration:  Concentration: Good and Attention Span: Good  Recall:  Good  Fund of Knowledge: Good  Language: Good  Assets:  Desire for Improvement  ADL's:  Intact  Cognition: WNL  Prognosis:  Good    DIAGNOSES:    ICD-10-CM   1. Recurrent major depressive disorder, in partial remission ([place])  F33.41   2. Generalized anxiety disorder  F41.1   3. Insomnia, unspecified type  G47.00   4. Fatigue, unspecified type  R53.83   5. CLL (chronic lymphocytic leukemia) (HCC)  C91.10     Receiving Psychotherapy: No    RECOMMENDATIONS:  PDMP was reviewed. I provided 20 minutes of non face to face time during this encounter. Continue Abilify 5 mg every morning. Continue Wellbutrin XL 150 mg, 3 p.o. daily. Continue Valium 5 mg 3 times daily as needed. Continue modafinil 200 mg every morning. She can take 1/2 in am and 1/2 at lunch. I don't  recommend increasing it.  Usually, less is more, for her dx.  Continue Zoloft 300 mg daily. Recommend counseling. Return in 3 months.  Michele Moat, PA-C

## 2021-03-20 ENCOUNTER — Encounter: Payer: Self-pay | Admitting: Physician Assistant

## 2021-03-20 ENCOUNTER — Telehealth (INDEPENDENT_AMBULATORY_CARE_PROVIDER_SITE_OTHER): Payer: BC Managed Care – PPO | Admitting: Physician Assistant

## 2021-03-20 DIAGNOSIS — C9110 Chronic lymphocytic leukemia of B-cell type not having achieved remission: Secondary | ICD-10-CM

## 2021-03-20 DIAGNOSIS — F3341 Major depressive disorder, recurrent, in partial remission: Secondary | ICD-10-CM

## 2021-03-20 DIAGNOSIS — F411 Generalized anxiety disorder: Secondary | ICD-10-CM | POA: Diagnosis not present

## 2021-03-20 MED ORDER — DIAZEPAM 5 MG PO TABS: 5.0000 mg | ORAL_TABLET | Freq: Three times a day (TID) | ORAL | 5 refills | Status: DC | PRN

## 2021-03-20 MED ORDER — SERTRALINE HCL 100 MG PO TABS: ORAL_TABLET | ORAL | 1 refills | Status: DC

## 2021-03-20 MED ORDER — MODAFINIL 200 MG PO TABS: 200.0000 mg | ORAL_TABLET | Freq: Every day | ORAL | 5 refills | Status: DC

## 2021-03-20 MED ORDER — BUPROPION HCL ER (XL) 150 MG PO TB24: ORAL_TABLET | ORAL | 1 refills | Status: DC

## 2021-03-20 MED ORDER — ARIPIPRAZOLE 5 MG PO TABS: ORAL_TABLET | ORAL | 1 refills | Status: DC

## 2021-03-20 NOTE — Progress Notes (Signed)
[facility]  Patient ID: Michele Dodson,  MRN: [id number]  PCP: Serita Butcher, FNP  Date of Evaluation: 03/20/2021 Time spent:25 minutes  Chief Complaint:  Chief Complaint    Anxiety; Depression; Follow-up     Virtual Visit via Telehealth  I connected with patient by telephone, with their informed consent, and verified patient privacy and that I am speaking with the correct person using two identifiers.  I am private, in my office and the patient is at home.  I discussed the limitations, risks, security and privacy concerns of performing an evaluation and management service by phone and the availability of in person appointments. I also discussed with the patient that there may be a patient responsible charge related to this service. The patient expressed understanding and agreed to proceed.   I discussed the assessment and treatment plan with the patient. The patient was provided an opportunity to ask questions and all were answered. The patient agreed with the plan and demonstrated an understanding of the instructions.   The patient was advised to call back or seek an in-person evaluation if the symptoms worsen or if the condition fails to improve as anticipated.  I provided 25 minutes of non-face-to-face time during this encounter.  HISTORY/CURRENT STATUS: For routine med check.  Still has a lot of stress. Drama with her daughter. And her Mom has dementia, lives alone, does know pt, but she'll move stuff and can't find it, things like that. Jalysa lives next door and she has to go over there several times a day, and it's just aggravating. Someone comes in several days a week and takes her places. So that helps take the load off of her.  Doesn't feel sad or depressed. Energy and motivation are fair to good. The Modafanil does help some, she doesn't take it routinely.  Her white blood cell count went up in December, (has CLL) but states the oncologist did not seem  too concerned about it and she goes back to see them sometime this month.  Does not cry easily.  Not isolating.  Sleeps well most of the time.  Denies suicidal or homicidal thoughts.  Still has anxiety, mostly generalized, Valium does help.  She does not always take it 3 times a day but just knowing it is available to her is a good thing.  Patient denies increased energy with decreased need for sleep, no increased talkativeness, no racing thoughts, no impulsivity or risky behaviors, no increased spending, no increased libido, no grandiosity, no increased irritability or anger, and no hallucinations.  Denies dizziness, syncope, seizures, numbness, tingling, tremor, tics, unsteady gait, slurred speech, confusion. Denies muscle or joint pain, stiffness, or dystonia.  Individual Medical History/ Review of Systems: Changes? :Yes   WBC increased since LOV.   Past medications for mental health diagnoses include: Prozac, Zoloft, Valium, Provigil, Buspar, Traz, Klonopin, Xanax, Zyban, Abilify, Wellbutrin, Provigil  Allergies: Codeine  Current Medications:  Current Outpatient Medications:  .  b complex vitamins capsule, Take 1 capsule by mouth daily., Disp: 30 capsule, Rfl: 1 .  calcium citrate-vitamin D (CITRACAL+D) 315-200 MG-UNIT tablet, Take 1 tablet by mouth 2 (two) times daily., Disp: , Rfl:  .  ELDERBERRY PO, Take by mouth., Disp: , Rfl:  .  esomeprazole (NEXIUM) 40 MG capsule, Take 40 mg by mouth 2 (two) times daily before a meal. , Disp: , Rfl:  .  losartan (COZAAR) 100 MG tablet, losartan 100 mg tablet  TAKE ONE TABLET BY MOUTH EVERY  DAY, Disp: , Rfl:  .  Multiple Vitamin (MULTIVITAMIN) tablet, Take 1 tablet by mouth daily., Disp: , Rfl:  .  zinc gluconate 50 MG tablet, Take 50 mg by mouth daily., Disp: , Rfl:  .  amoxicillin-clavulanate (AUGMENTIN) 875-125 MG tablet, Take 1 tablet by mouth every 12 (twelve) hours. (Patient not taking: No sig reported), Disp: 14 tablet, Rfl: 0 .   ARIPiprazole (ABILIFY) 5 MG tablet, TAKE ONE TABLET BY MOUTH EVERY MORNING AFTER BREAKFAST, Disp: 90 tablet, Rfl: 1 .  buPROPion (WELLBUTRIN XL) 150 MG 24 hr tablet, TAKE THREE TABLETS BY MOUTH EVERY MORNING, Disp: 270 tablet, Rfl: 1 .  diazepam (VALIUM) 5 MG tablet, Take 1 tablet (5 mg total) by mouth every 8 (eight) hours as needed. for anxiety, Disp: 90 tablet, Rfl: 5 .  etodolac (LODINE) 200 MG capsule, Take 200 mg by mouth every 8 (eight) hours. (Patient not taking: No sig reported), Disp: , Rfl:  .  HYDROcodone-homatropine (HYCODAN) 5-1.5 MG/5ML syrup, TAKE 1 TEASPOONFUL BY MOUTH EVERY 8 HOURS AS NEEDED FOR COUGH (Patient not taking: No sig reported), Disp: , Rfl:  .  modafinil (PROVIGIL) 200 MG tablet, Take 1 tablet (200 mg total) by mouth daily., Disp: 30 tablet, Rfl: 5 .  sertraline (ZOLOFT) 100 MG tablet, TAKE THREE TABLETS BY MOUTH ONCE DAILY, Disp: 270 tablet, Rfl: 1 Medication Side Effects: none  Family Medical/ Social History: Changes? No  MENTAL HEALTH EXAM:  There were no vitals taken for this visit.There is no height or weight on file to calculate BMI.  General Appearance: uanble to assess  Eye Contact:  uanble to assess  Speech:  Clear and Coherent  Volume:  Normal  Mood:  Euthymic  Affect:  unable to assess  Thought Process:  Goal Directed and Descriptions of Associations: Intact  Orientation:  Full (Time, Place, and Person)  Thought Content: Logical   Suicidal Thoughts:  No  Homicidal Thoughts:  No  Memory:  WNL  Judgement:  Good  Insight:  Good  Psychomotor Activity:  unable to assess  Concentration:  Concentration: Good and Attention Span: Good  Recall:  Good  Fund of Knowledge: Good  Language: Good  Assets:  Desire for Improvement  ADL's:  Intact  Cognition: WNL  Prognosis:  Good   DIAGNOSES:    ICD-10-CM   1. Recurrent major depressive disorder, in partial remission ([place])  F33.41   2. Generalized anxiety disorder  F41.1   3. CLL (chronic lymphocytic  leukemia) ([facility])  C91.10     Receiving Psychotherapy: No    RECOMMENDATIONS:  PDMP was reviewed. I provided 25 minutes of non face to face time during this encounter including time spent before and after the visit in records review and charting. Overall she is doing well so no changes will be made. Continue Abilify 5 mg every morning. Continue Wellbutrin XL 150 mg, 3 p.o. daily. Continue Valium 5 mg 3 times daily as needed. Continue modafinil 200 mg every morning. She can take 1/2 in am and 1/2 at lunch. I don't recommend increasing it.  Usually, less is more, for her dx.  Continue Zoloft 300 mg daily. PCP and oncologist follow her labs. Recommend counseling. Return in 6 months.  Donnal Moat, PA-C

## 2021-09-18 ENCOUNTER — Other Ambulatory Visit: Payer: Self-pay | Admitting: Physician Assistant

## 2021-09-20 ENCOUNTER — Other Ambulatory Visit: Payer: Self-pay | Admitting: Physician Assistant

## 2021-09-22 ENCOUNTER — Other Ambulatory Visit: Payer: Self-pay | Admitting: Physician Assistant

## 2021-10-30 ENCOUNTER — Other Ambulatory Visit: Payer: Self-pay | Admitting: Physician Assistant

## 2021-10-30 NOTE — Telephone Encounter (Signed)
Please schedule appt

## 2021-10-31 NOTE — Telephone Encounter (Signed)
Left message for pt to call and schedule

## 2021-11-01 ENCOUNTER — Other Ambulatory Visit: Payer: Self-pay | Admitting: Physician Assistant

## 2021-11-02 ENCOUNTER — Other Ambulatory Visit: Payer: Self-pay | Admitting: Physician Assistant

## 2021-12-25 ENCOUNTER — Other Ambulatory Visit: Payer: Self-pay | Admitting: Physician Assistant

## 2021-12-25 NOTE — Telephone Encounter (Signed)
Please schedule appt

## 2022-01-10 NOTE — Telephone Encounter (Signed)
I meant [employer]

## 2022-01-10 NOTE — Telephone Encounter (Signed)
LM w/pt 1/25 to call and schedule an appt w/[facility]

## 2022-01-12 NOTE — Telephone Encounter (Signed)
LVM again

## 2022-01-12 NOTE — Telephone Encounter (Signed)
Please call to schedule an appt. Last seen 4/4 with RTC in 6 mo.

## 2022-01-12 NOTE — Telephone Encounter (Signed)
Lm for pt to schedule

## 2022-01-12 NOTE — Telephone Encounter (Signed)
Pt's last visit 03/2021 due back 6 months. Message has been left to set up apt.

## 2022-01-18 ENCOUNTER — Other Ambulatory Visit: Payer: Self-pay | Admitting: Physician Assistant

## 2022-08-29 ENCOUNTER — Ambulatory Visit (INDEPENDENT_AMBULATORY_CARE_PROVIDER_SITE_OTHER): Payer: BLUE CROSS/BLUE SHIELD

## 2022-08-29 ENCOUNTER — Ambulatory Visit
Admission: EM | Admit: 2022-08-29 | Discharge: 2022-08-29 | Disposition: A | Discharge status: Home / Self Care | Payer: BLUE CROSS/BLUE SHIELD | Attending: Physician Assistant | Admitting: Physician Assistant

## 2022-08-29 DIAGNOSIS — C9110 Chronic lymphocytic leukemia of B-cell type not having achieved remission: Secondary | ICD-10-CM

## 2022-08-29 DIAGNOSIS — R509 Fever, unspecified: Secondary | ICD-10-CM

## 2022-08-29 DIAGNOSIS — J189 Pneumonia, unspecified organism: Secondary | ICD-10-CM | POA: Diagnosis not present

## 2022-08-29 DIAGNOSIS — R051 Acute cough: Secondary | ICD-10-CM

## 2022-08-29 DIAGNOSIS — R059 Cough, unspecified: Secondary | ICD-10-CM | POA: Diagnosis not present

## 2022-08-29 DIAGNOSIS — R911 Solitary pulmonary nodule: Secondary | ICD-10-CM

## 2022-08-29 MED ORDER — LEVOFLOXACIN 750 MG PO TABS: 750.0000 mg | ORAL_TABLET | Freq: Every day | ORAL | 0 refills | Status: AC

## 2022-08-29 MED ORDER — CEFTRIAXONE SODIUM 500 MG IJ SOLR: 1000.0000 mg | Freq: Once | INTRAMUSCULAR | Status: DC

## 2022-08-29 MED ORDER — CEFTRIAXONE SODIUM 1 G IJ SOLR
1.0000 g | Freq: Once | INTRAMUSCULAR | Status: AC
  Administered 2022-08-29: 1 g via INTRAMUSCULAR

## 2022-08-29 MED ORDER — HYDROCODONE BIT-HOMATROP MBR 5-1.5 MG/5ML PO SOLN: 5.0000 mL | Freq: Four times a day (QID) | ORAL | 0 refills | Status: DC | PRN

## 2022-08-29 MED ORDER — ALBUTEROL SULFATE HFA 108 (90 BASE) MCG/ACT IN AERS: 1.0000 | INHALATION_SPRAY | Freq: Four times a day (QID) | RESPIRATORY_TRACT | 0 refills | Status: AC | PRN

## 2022-08-29 NOTE — ED Triage Notes (Addendum)
Pt states she had covid x1 month ago, c/o cough, fever 103-104 since Sunday, pt states she took x2 tylenol around 1:00pm  Pt declined swabbing for the flu

## 2022-08-29 NOTE — Discharge Instructions (Addendum)
-  You have a large pneumonia of the left lower lobe.  This is likely the cause of your fever and cough.  I have sent antibiotics to the pharmacy as well as cough medication and albuterol inhaler. - We have given you an injection of an antibiotic in the clinic tonight.  Take your first dose of the Levaquin today. - Increase your rest and fluids. -Follow-up with your primary care provider.  There was a nodule of the right upper lobe seen on the x-ray.  May correspond to your CLL.  Radiologist recommends CT scan.  This is a good idea since it has been a while.  You have decided to follow-up with your primary care provider about this instead of have one here today. - Go to emergency department if you have any severe acute worsening of any of her symptoms including uncontrollable fever, weakness, increased shortness of breath, chest pain on breathing, dizziness, feeling faint or passing out.

## 2022-08-29 NOTE — ED Provider Notes (Addendum)
[facility]    CSN: [id number] Arrival date & time: 08/29/22  1540      History   Chief Complaint Chief Complaint  Patient presents with   Fever   Cough    HPI Michele Dodson is a 61 y.o. female 4 to 5-week history of cough and congestion.  Patient reports she was diagnosed with COVID a month ago but the symptoms were not that bad.  She says it seemed like she developed a sinus infection after that.  She was prescribed Augmentin and states that she completed that medication a week ago.  She reports that she started to have fever up to 103 degrees 3 days ago.  She says that she has been having a lot of chills and sweats and mild increase shortness of breath.  Reports the cough is productive of white sputum.  She denies any chest pain or pain on breathing.  Reports the cough has kept her up at night.  She has tried Promethazine DM as prescribed by her primary care provider and also various over-the-counter cough medicines without relief.  Denies any recent sick contacts or known exposure to flu.  Patient's medical history is significant for chronic lymphocytic leukemia and she follows up with [facility] about this.  Other medical history significant for hypertension, GERD, obesity and allergies.  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 No pertinent surgical  history.  OB History   No obstetric history on file.      Home Medications    Prior to Admission medications   Medication Sig Start Date End Date Taking? Authorizing Provider  albuterol (VENTOLIN HFA) 108 (90 Base) MCG/ACT inhaler Inhale 1-2 puffs into the lungs every 6 (six) hours as needed for wheezing or shortness of breath. 08/29/22  Yes Laurene Footman B, PA-C  HYDROcodone bit-homatropine (HYCODAN) 5-1.5 MG/5ML syrup Take 5 mLs by mouth every 6 (six) hours as needed for cough. 08/29/22  Yes Danton Clap, PA-C  levofloxacin (LEVAQUIN) 750 MG tablet Take 1 tablet (750 mg total) by mouth daily for 7 days. 08/29/22 09/05/22 Yes Laurene Footman B, PA-C  losartan (COZAAR) 100 MG tablet losartan 100 mg tablet  TAKE ONE TABLET BY MOUTH EVERY DAY   Yes [provider]  ARIPiprazole (ABILIFY) 5 MG tablet TAKE ONE TABLET BY MOUTH EVERY MORNING AFTER BREAKFAST 03/20/21   Donnal Moat T, PA-C  b complex vitamins capsule Take 1 capsule by mouth daily. 10/07/19   Addison Lank, PA-C  buPROPion (WELLBUTRIN XL) 150 MG 24 hr tablet TAKE THREE TABLETS BY MOUTH EVERY MORNING 03/20/21   Donnal Moat T, PA-C  calcium citrate-vitamin D (CITRACAL+D) 315-200 MG-UNIT tablet Take 1 tablet by mouth 2 (two) times daily.    [provider]  diazepam (VALIUM) 5 MG tablet Take 1 tablet (5 mg total) by mouth every 8 (eight) hours as needed. for  anxiety 03/20/21   Donnal Moat T, PA-C  ELDERBERRY PO Take by mouth.    [provider]  esomeprazole (NEXIUM) 40 MG capsule Take 40 mg by mouth 2 (two) times daily before a meal.     [provider]  etodolac (LODINE) 200 MG capsule Take 200 mg by mouth every 8 (eight) hours. Patient not taking: No sig reported    [provider]  HYDROcodone-homatropine (HYCODAN) 5-1.5 MG/5ML syrup TAKE 1 TEASPOONFUL BY MOUTH EVERY 8 HOURS AS NEEDED FOR COUGH Patient not taking: No sig reported 05/19/19   [provider]  modafinil (PROVIGIL)  200 MG tablet Take 1 tablet (200 mg total) by mouth daily. 03/20/21   Donnal Moat T, PA-C  Multiple Vitamin (MULTIVITAMIN) tablet Take 1 tablet by mouth daily.    [provider]  sertraline (ZOLOFT) 100 MG tablet TAKE THREE TABLETS BY MOUTH ONCE DAILY 03/20/21   Donnal Moat T, PA-C  zinc gluconate 50 MG tablet Take 50 mg by mouth daily.    [provider]    Family History Family History  Problem Relation Age of Onset   Melanoma Mother    Breast cancer Mother    Melanoma Father     Social History Social History   Tobacco Use   Smoking status: Some Days    Packs/day: 0.20    Types: Cigarettes, E-cigarettes   Smokeless tobacco: Never   Tobacco comments:    not a lot of vaping  Vaping Use   Vaping Use: Every day  Substance Use Topics   Alcohol use: Never    Alcohol/week: 0.0 standard drinks of alcohol   Drug use: Never     Allergies   Codeine   Review of Systems Review of Systems  Constitutional:  Positive for chills, diaphoresis, fatigue and fever.  HENT:  Positive for congestion, ear pain (right), rhinorrhea and sinus pressure. Negative for sinus pain and sore throat.   Respiratory:  Positive for cough and shortness of breath.   Cardiovascular:  Negative for chest pain.  Gastrointestinal:  Negative for abdominal pain, nausea and vomiting.  Musculoskeletal:  Negative for arthralgias and myalgias.  Skin:  Negative for rash.  Neurological:  Negative for weakness and headaches.  Hematological:  Negative for adenopathy.     Physical Exam Triage Vital Signs ED Triage Vitals [08/29/22 1605]  Enc Vitals Group     BP      Pulse      Resp      Temp      Temp src      SpO2      Weight 250 lb (113.4 kg)     Height '5\' 6"'$  (1.676 m)     Head Circumference      Peak Flow      Pain Score 0     Pain Loc      Pain Edu?      Excl. in [place]?    No data found.  Updated Vital Signs BP 127/76 (BP Location: Left Arm)   Pulse 94   Temp 98.3 F (36.8 C)  (Oral)   Ht '5\' 6"'$  (1.676 m)   Wt 250 lb (113.4 kg)   LMP  (LMP Unknown)   SpO2 95%   BMI 40.35 kg/m     Physical Exam Vitals and nursing note reviewed.  Constitutional:      General: She is not in acute distress.    Appearance: Normal appearance. She is ill-appearing. She is not toxic-appearing.  HENT:     Head: Normocephalic and atraumatic.     Right Ear: A middle ear effusion is present. Tympanic membrane is injected and bulging.     Left Ear: A middle ear effusion is present. Tympanic membrane is bulging.     Nose: Congestion present.     Mouth/Throat:     Mouth: Mucous membranes are moist.     Pharynx: Oropharynx is clear. Posterior oropharyngeal erythema present.  Eyes:     General: No scleral icterus.       Right eye: No discharge.        Left eye: No discharge.     Conjunctiva/sclera: Conjunctivae normal.  Cardiovascular:     Rate and Rhythm: Normal rate and regular rhythm.     Heart sounds: Normal heart sounds.  Pulmonary:     Effort: Pulmonary effort is normal. No respiratory distress.     Breath sounds: Wheezing (bilateral lower lobes (L>R)) present.  Musculoskeletal:     Cervical back: Neck supple.  Skin:    General: Skin is dry.  Neurological:     General: No focal deficit present.     Mental Status: She is alert. Mental status is at baseline.     Motor: No weakness.     Gait: Gait normal.  Psychiatric:        Mood and Affect: Mood normal.        Behavior: Behavior normal.        Thought Content: Thought content normal.      UC Treatments / Results  Labs (all labs ordered are listed, but only abnormal results are displayed) Labs Reviewed - No data to display  EKG   Radiology DG Chest 2 View  Addendum Date: 08/29/2022   ADDENDUM REPORT: 08/29/2022 16:48 ADDENDUM: Reportedly the patient has a history of CLL. The nodular density may be related to that history. Electronically Signed   By: Marijo Conception M.D.   On: 08/29/2022 16:48   Result Date:  08/29/2022 CLINICAL DATA:  Fever, cough. EXAM: CHEST - 2 VIEW COMPARISON:  June 15, 2015. FINDINGS: The heart size and mediastinal contours are within normal limits. Interval development of large left upper lobe opacity concerning for pneumonia. Possible nodular density seen in right upper lobe. The visualized skeletal structures are unremarkable. IMPRESSION: Probable large left upper lobe pneumonia. Possible nodular density seen in right upper lobe. CT scan of the chest is recommended for further evaluation. Electronically Signed: By: Marijo Conception M.D. On: 08/29/2022 16:30    Procedures Procedures (including critical care time)  Medications Ordered in UC Medications  cefTRIAXone (ROCEPHIN) injection 1,000 mg (has no administration in time range)  cefTRIAXone (ROCEPHIN) injection 1 g (has no administration in time range)    Initial Impression / Assessment and Plan / UC Course  I have reviewed the triage vital signs and the nursing notes.  Pertinent labs & imaging results that were available during my care of the patient were reviewed by me and considered in my medical decision making (see chart for details).   61 year old female with history of CLL, hypertension, obesity presents for greater than 1 month history of cough and congestion, sinus pressure.  Reports COVID diagnosis 1 month ago followed by sinusitis.  Has taken Augmentin and recently finished prednisone.  Has tried multiple over-the-counter cough medicines and Promethazine DM without improvement in symptoms.  Reports new onset fever of 103 degrees and worsening of symptoms over the past 3 days.  Has been taking  Tylenol for fever.  Vitals are normal and stable.  Patient is in no acute distress but she is ill-appearing.  She is nontoxic.  On exam she has nasal congestion, clear effusion of bilateral TMs with injection of the right TM, erythema of posterior pharynx with clear postnasal drainage.  Wheezes heard in bilateral lower lobes.    Chest x-ray obtained today shows left lower lobe pneumonia and right upper lobe nodular density.  Radiologist recommends CT scan but states the nodular density could be related to CLL.  Discussed getting a CT scan with the patient but she declines to have that done at this time and says she will follow-up with her primary care provider and oncologist through [facility] to have the CT performed.  I discussed the importance of making sure to follow-up about that.  We will treat at this time with Levaquin since she just finished amoxicillin.  Patient given 1 g ceftriaxone in clinic.  Sent Hycodan to pharmacy for cough and albuterol inhaler.  Encouraged her to increase rest and fluids.  Thoroughly reviewed ED precautions with her.   Final Clinical Impressions(s) / UC Diagnoses   Final diagnoses:  Pneumonia of left lower lobe due to infectious organism  Right upper lobe pulmonary nodule  Acute cough  Fever, unspecified  CLL (chronic lymphocytic leukemia) ([place])     Discharge Instructions      -You have a large pneumonia of the left lower lobe.  This is likely the cause of your fever and cough.  I have sent antibiotics to the pharmacy as well as cough medication and albuterol inhaler. - We have given you an injection of an antibiotic in the clinic tonight.  Take your first dose of the Levaquin today. - Increase your rest and fluids. -Follow-up with your primary care provider.  There was a nodule of the right upper lobe seen on the x-ray.  May correspond to your CLL.  Radiologist recommends CT scan.  This is a good idea since it has been a while.  You have decided to follow-up with your primary care provider about this instead of have one here today. - Go to emergency department if you have any severe acute worsening of any of her symptoms including uncontrollable fever, weakness, increased shortness of breath, chest pain on breathing, dizziness, feeling faint or passing out.     ED Prescriptions      Medication Sig Dispense Auth. Provider   levofloxacin (LEVAQUIN) 750 MG tablet Take 1 tablet (750 mg total) by mouth daily for 7 days. 7 tablet Laurene Footman B, PA-C   HYDROcodone bit-homatropine (HYCODAN) 5-1.5 MG/5ML syrup Take 5 mLs by mouth every 6 (six) hours as needed for cough. 120 mL Laurene Footman B, PA-C   albuterol (VENTOLIN HFA) 108 (90 Base) MCG/ACT inhaler Inhale 1-2 puffs into the lungs every 6 (six) hours as needed for wheezing or shortness of breath. 1 g Danton Clap, PA-C      PDMP not reviewed this encounter.   Danton Clap, PA-C 08/29/22 1709    Laurene Footman B, PA-C 08/29/22 1709

## 2022-09-06 ENCOUNTER — Ambulatory Visit
Admission: EM | Admit: 2022-09-06 | Discharge: 2022-09-06 | Disposition: A | Discharge status: Home / Self Care | Payer: BC Managed Care – PPO

## 2022-09-06 ENCOUNTER — Other Ambulatory Visit: Payer: Self-pay

## 2022-09-06 DIAGNOSIS — J189 Pneumonia, unspecified organism: Secondary | ICD-10-CM

## 2022-09-06 NOTE — Discharge Instructions (Signed)
Please go to the nearest emergency department to obtain a CT scan which was recommended at your last visit by radiologist and for evaluation for IV antibiotics  You have completed a course of Levaquin and also was given initially an injection of Rocephin which are shown antibiotics which should have cleared your symptoms but as you are not feeling better and there has been no improvement in your symptoms I would recommend a higher level of care at this time

## 2022-09-06 NOTE — ED Provider Notes (Signed)
Patient presents with persisting fevers, productive cough with Esme Freund sputum, nasal congestion, intermittent wheezing and increased fatigue for 5 to 6 weeks.  Was evaluated in urgent care on 08/29/2022, diagnosed with pneumonia via x-ray, at that time it was recommended a CT scan by radiologist, patient declined and endorsed that she would notify her primary doctor who would order outpatient, patient has not done so as she has not been feeling well.  Completed Levaquin course 2 days ago and was initially given injection of Rocephin in office with no improvement in symptoms.  History of CLL.  Vital signs are stable at this time with O2 saturation at 93%, blood pressure is slightly low at 99/53, patient is visibly ill but nontoxic-appearing, as she is completing antibiotic course with no signs of improvement she is being sent to the nearest emergency department for evaluation for IV antibiotics and completion of recommended CT.  To be escorted by family to [facility] per patient preference   Hans [place], [place] 09/06/22 1147

## 2022-09-06 NOTE — ED Triage Notes (Signed)
Pt states she just finished Levaquin for PNA a few days ago. Still having fatigue, fever, and cough.

## 2022-09-07 DIAGNOSIS — J189 Pneumonia, unspecified organism: Secondary | ICD-10-CM | POA: Insufficient documentation

## 2022-09-07 DIAGNOSIS — F17200 Nicotine dependence, unspecified, uncomplicated: Secondary | ICD-10-CM | POA: Insufficient documentation

## 2022-11-14 ENCOUNTER — Encounter: Payer: Self-pay | Admitting: Podiatry

## 2022-11-14 ENCOUNTER — Ambulatory Visit: Payer: Self-pay | Admitting: Podiatry

## 2022-11-14 ENCOUNTER — Ambulatory Visit (INDEPENDENT_AMBULATORY_CARE_PROVIDER_SITE_OTHER): Payer: Self-pay

## 2022-11-14 DIAGNOSIS — M7751 Other enthesopathy of right foot: Secondary | ICD-10-CM

## 2022-11-14 DIAGNOSIS — M722 Plantar fascial fibromatosis: Secondary | ICD-10-CM

## 2022-11-14 DIAGNOSIS — M778 Other enthesopathies, not elsewhere classified: Secondary | ICD-10-CM

## 2022-11-14 DIAGNOSIS — D2371 Other benign neoplasm of skin of right lower limb, including hip: Secondary | ICD-10-CM

## 2022-11-14 MED ORDER — DEXAMETHASONE SODIUM PHOSPHATE 120 MG/30ML IJ SOLN
2.0000 mg | Freq: Once | INTRAMUSCULAR | Status: AC
  Administered 2022-11-14: 2 mg via INTRA_ARTICULAR

## 2022-11-14 NOTE — Progress Notes (Signed)
Subjective:  Patient ID: Michele Dodson, female    DOB: 07/27/1961,  MRN: [id number] HPI Chief Complaint  Patient presents with   Foot Pain    Plantar arch left - soft lump x 3 weeks, tender at times when walking a lot  Sub 5th MPJ right - small, callused areas   New Patient (Initial Visit)    Est pt 2018    61 y.o. female presents with the above complaint.   ROS: Denies fever chills nausea vomit muscle aches pains calf pain back pain chest pain shortness of breath.    Past Medical History:  Diagnosis Date   Cancer ([place]) 07/2017   leukemia (CLL) tx through [facility]   Hypertension    No past surgical history on file.  Current Outpatient Medications:    albuterol (VENTOLIN HFA) 108 (90 Base) MCG/ACT inhaler, Inhale 1-2 puffs into the lungs every 6 (six) hours as needed for wheezing or shortness of breath., Disp: 1 g, Rfl: 0   b complex vitamins capsule, Take 1 capsule by mouth daily., Disp: 30 capsule, Rfl: 1   calcium citrate-vitamin D (CITRACAL+D) 315-200 MG-UNIT tablet, Take 1 tablet by mouth 2 (two) times daily., Disp: , Rfl:    ELDERBERRY PO, Take by mouth., Disp: , Rfl:    losartan (COZAAR) 100 MG tablet, losartan 100 mg tablet  TAKE ONE TABLET BY MOUTH EVERY DAY, Disp: , Rfl:    Multiple Vitamin (MULTIVITAMIN) tablet, Take 1 tablet by mouth daily., Disp: , Rfl:    zinc gluconate 50 MG tablet, Take 50 mg by mouth daily., Disp: , Rfl:   Allergies  Allergen Reactions   Codeine Hives   Review of Systems Objective:  There were no vitals filed for this visit.  General: Well developed, nourished, in no acute distress, alert and oriented x3   Dermatological: Skin is warm, dry and supple bilateral. Nails x 10 are well maintained; remaining integument appears unremarkable at this time. There are no open sores, no preulcerative lesions, no rash or signs of infection present.  Porokeratotic lesions beneath the fifth metatarsal head of the right foot on the plantar lateral aspect  most likely secondary to impingement and shoes.  There is also mild erythema overlying this with fluctuance beneath it most likely consistent with a bursitis.  Small less than 1 cm diameter plantar fibroma to the distal medial band of the plantar fascia just proximal to the plantar first metatarsal phalangeal joint area.  Nontender on palpation does not appear to be fluctuant.  Vascular: Dorsalis Pedis artery and Posterior Tibial artery pedal pulses are 2/4 bilateral with immedate capillary fill time. Pedal hair growth present. No varicosities and no lower extremity edema present bilateral.   Neruologic: Grossly intact via light touch bilateral. Vibratory intact via tuning fork bilateral. Protective threshold with Semmes Wienstein monofilament intact to all pedal sites bilateral. Patellar and Achilles deep tendon reflexes 2+ bilateral. No Babinski or clonus noted bilateral.   Musculoskeletal: No gross boney pedal deformities bilateral. No pain, crepitus, or limitation noted with foot and ankle range of motion bilateral. Muscular strength 5/5 in all groups tested bilateral.  Gait: Unassisted, Nonantalgic.    Radiographs:  Radiographs taken today do not demonstrate any type of osseous abnormalities no calcification of the soft tissue lesion right.  Assessment & Plan:   Assessment: Plantar fibroma left.  Bursitis subfifth metatarsal head right.  Porokeratosis benign skin lesion subfifth metatarsal head right foot.    Plan: I injected dexamethasone and local anesthetic into  the bursa today.  Debrided the benign skin lesion.  Discussed injecting the fibroma which she declined.  Follow-up with her as needed.     Glenora Morocho T. [place], [place]

## 2023-04-04 ENCOUNTER — Other Ambulatory Visit: Payer: Self-pay | Admitting: Family Medicine

## 2023-04-04 DIAGNOSIS — Z1231 Encounter for screening mammogram for malignant neoplasm of breast: Secondary | ICD-10-CM

## 2023-04-30 ENCOUNTER — Other Ambulatory Visit: Payer: Self-pay | Admitting: Family Medicine

## 2023-04-30 DIAGNOSIS — N6002 Solitary cyst of left breast: Secondary | ICD-10-CM

## 2023-05-02 ENCOUNTER — Encounter: Payer: Self-pay | Admitting: Family Medicine

## 2023-05-06 ENCOUNTER — Encounter: Payer: Self-pay | Admitting: Family Medicine

## 2023-05-07 ENCOUNTER — Other Ambulatory Visit: Payer: Self-pay | Admitting: Family Medicine

## 2023-05-07 ENCOUNTER — Encounter: Payer: Self-pay | Admitting: Family Medicine

## 2023-05-07 DIAGNOSIS — N63 Unspecified lump in unspecified breast: Secondary | ICD-10-CM

## 2023-05-21 ENCOUNTER — Ambulatory Visit
Admission: RE | Admit: 2023-05-21 | Discharge: 2023-05-21 | Disposition: A | Discharge status: Home / Self Care | Payer: BC Managed Care – PPO | Source: Ambulatory Visit | Attending: Family Medicine | Admitting: Family Medicine

## 2023-05-21 DIAGNOSIS — N63 Unspecified lump in unspecified breast: Secondary | ICD-10-CM | POA: Diagnosis present

## 2023-09-20 ENCOUNTER — Encounter: Payer: Self-pay | Admitting: Emergency Medicine

## 2023-09-20 ENCOUNTER — Ambulatory Visit
Admission: EM | Admit: 2023-09-20 | Discharge: 2023-09-20 | Disposition: A | Discharge status: Home / Self Care | Payer: BC Managed Care – PPO | Attending: Family Medicine | Admitting: Family Medicine

## 2023-09-20 DIAGNOSIS — R1012 Left upper quadrant pain: Secondary | ICD-10-CM

## 2023-09-20 DIAGNOSIS — R079 Chest pain, unspecified: Secondary | ICD-10-CM | POA: Diagnosis not present

## 2023-09-20 DIAGNOSIS — R0789 Other chest pain: Secondary | ICD-10-CM | POA: Diagnosis not present

## 2023-09-20 NOTE — ED Triage Notes (Signed)
Patient c/o pressure and sharp pain in the middle of her sternum that started on Wed.  Patient reports the pain is constant.  Patient reports nausea.  Patient reports that she has Leukemia.  Patient reports decrease in appetite.  Patient denies fevers.

## 2023-09-20 NOTE — ED Provider Notes (Signed)
[facility]    CSN: [id number] Arrival date & time: 09/20/23  4270      History   Chief Complaint Chief Complaint  Patient presents with   Chest Pain    HPI Michele Dodson is a 62 y.o. female.   HPI  62 year old female with a medical history significant for CLL, hypertension, GERD, and elevated BMI presents for evaluation of sharp pain over her xiphoid complex that started 2 days ago.  She describes the pain as constant and rates it an 8/10.  There is some associated decrease in appetite and nausea but no shortness of breath, fever, or sweating.  She states she has had similar episodes in the past that have been related to either uninflamed spleen or enlarged lymph node.  She has tried taking over-the-counter Tylenol and ibuprofen without improvement of symptoms.  She does report that laying down and laying on her left side does improve the pain.  Past Medical History:  Diagnosis Date   Cancer (HCC) 07/2017   leukemia (CLL) tx through [facility]   Hypertension     Patient Active Problem List   Diagnosis Date Noted   Pneumonia involving right lung 09/07/2022   Tobacco use disorder 09/07/2022   MDD (major depressive disorder) 12/14/2018   GAD (generalized anxiety disorder) 12/14/2018   Glucose intolerance (impaired glucose tolerance) 09/06/2018   Seasonal allergies 09/06/2018   CLL (chronic lymphocytic leukemia) (HCC) 08/26/2017   SOB (shortness of breath) 08/07/2017   BMI 40.0-44.9, adult (HCC) 07/25/2017   GERD (gastroesophageal reflux disease) 09/20/2016   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      Home Medications    Prior to Admission medications   Medication Sig Start Date End Date Taking? Authorizing Provider  albuterol (VENTOLIN HFA) 108 (90 Base) MCG/ACT  inhaler Inhale 1-2 puffs into the lungs every 6 (six) hours as needed for wheezing or shortness of breath. 08/29/22   Eusebio Friendly B, PA-C  b complex vitamins capsule Take 1 capsule by mouth daily. 10/07/19   Melony Overly T, PA-C  calcium citrate-vitamin D (CITRACAL+D) 315-200 MG-UNIT tablet Take 1 tablet by mouth 2 (two) times daily.    [provider]  ELDERBERRY PO Take by mouth.    [provider]  losartan (COZAAR) 100 MG tablet losartan 100 mg tablet  TAKE ONE TABLET BY MOUTH EVERY DAY    [provider]  Multiple Vitamin (MULTIVITAMIN) tablet Take 1 tablet by mouth daily.    [provider]  zinc gluconate 50 MG tablet Take 50 mg by mouth daily.    [provider]    Family History Family History  Problem Relation Age of Onset   Melanoma Mother    Breast cancer Mother    Melanoma Father     Social History Social History   Tobacco Use   Smoking status: Some Days    Current packs/day: 0.20    Types: Cigarettes   Smokeless tobacco: Never   Tobacco comments:    not a lot of vaping  Vaping Use   Vaping status: Former  Substance Use Topics   Alcohol use: Never    Alcohol/week: 0.0 standard drinks of alcohol   Drug use: Never     Allergies   Codeine   Review  of Systems Review of Systems  Constitutional:  Negative for diaphoresis and fever.  Respiratory:  Negative for shortness of breath.   Cardiovascular:  Positive for chest pain.  Gastrointestinal:  Positive for nausea.     Physical Exam Triage Vital Signs ED Triage Vitals  Encounter Vitals Group     BP 09/20/23 0905 (!) 158/86     Systolic BP Percentile --      Diastolic BP Percentile --      Pulse Rate 09/20/23 0905 87     Resp 09/20/23 0905 15     Temp 09/20/23 0905 98.6 F (37 C)     Temp Source 09/20/23 0905 Oral     SpO2 09/20/23 0905 98 %     Weight 09/20/23 0902 249 lb 12.5 oz (113.3 kg)     Height 09/20/23 0902 5\' 6"  (1.676 m)     Head  Circumference --      Peak Flow --      Pain Score 09/20/23 0902 8     Pain Loc --      Pain Education --      Exclude from Growth Chart --    No data found.  Updated Vital Signs BP (!) 158/86 (BP Location: Left Arm)   Pulse 87   Temp 98.6 F (37 C) (Oral)   Resp 15   Ht 5\' 6"  (1.676 m)   Wt 249 lb 12.5 oz (113.3 kg)   LMP  (LMP Unknown)   SpO2 98%   BMI 40.32 kg/m   Visual Acuity Right Eye Distance:   Left Eye Distance:   Bilateral Distance:    Right Eye Near:   Left Eye Near:    Bilateral Near:     Physical Exam Vitals and nursing note reviewed.  Constitutional:      General: She is in acute distress.     Appearance: Normal appearance. She is not ill-appearing.     Comments: Patient appears to be in a moderate degree of pain.  HENT:     Head: Normocephalic and atraumatic.  Cardiovascular:     Rate and Rhythm: Normal rate and regular rhythm.     Pulses: Normal pulses.     Heart sounds: Normal heart sounds. No murmur heard.    No friction rub. No gallop.  Pulmonary:     Effort: Pulmonary effort is normal.     Breath sounds: Normal breath sounds. No wheezing, rhonchi or rales.     Comments: Patient is tender over the xiphoid complex. Chest:     Chest wall: Tenderness present.  Abdominal:     Palpations: Abdomen is soft.     Tenderness: There is abdominal tenderness.     Comments: Patient has tenderness over the epigastrium and left upper quadrant.  Skin:    General: Skin is warm and dry.     Capillary Refill: Capillary refill takes less than 2 seconds.     Findings: No rash.  Neurological:     General: No focal deficit present.     Mental Status: She is alert and oriented to person, place, and time.      UC Treatments / Results  Labs (all labs ordered are listed, but only abnormal results are displayed) Labs Reviewed - No data to display  EKG Normal sinus rhythm with a ventricular rate of 76 bpm PR interval 120 ms QRS duration 90 ms QT/QTc  396/445 ms Possible left atrial enlargement.  Poor R wave progression.  Radiology No results  found.  Procedures Procedures (including critical care time)  Medications Ordered in UC Medications - No data to display  Initial Impression / Assessment and Plan / UC Course  I have reviewed the triage vital signs and the nursing notes.  Pertinent labs & imaging results that were available during my care of the patient were reviewed by me and considered in my medical decision making (see chart for details).   Patient is a pleasant 62 year old female presenting for evaluation of pain over her xiphoid and epigastrium that started 2 days ago and is constant with an 8/10 intensity and is described as sharp.  On exam her cardiopulmonary exam reveals S1-S2 heart sounds with regular rate and rhythm and lung sounds that are clear to auscultation all fields.  Patient does have tenderness with palpation over the xiphoid complex as well as in the epigastrium and the left upper quadrant of the abdomen.  EKG collected in triage shows normal sinus rhythm with possible left atrial enlargement.  There is poor R wave progression on the EKG but no ST or T wave abnormalities noted.  No other tracings available in epic for comparison.  Patient recently had a CT scan of her chest without contrast which showed no new recurrent or metastatic disease within the chest.  Stable lymphadenopathy in chest and partially imaged retroperitoneum consistent with known CLL.  Given patient's extensive medical history I do feel that she needs imaging of her chest and abdomen, which we are unable to perform at the urgent care today.  Given that she is followed by [facility] hematology oncology I have suggested that she go to [facility] emergency department for further evaluation and treatment.  She has taken over-the-counter NSAIDs and Tylenol without any improvement of pain and she is rating her pain as a sharp 8/10.  I advised her that I have  nothing stronger to give her in the department so I am additionally referring her to the ER for pain control.  Patient is stable and left ambulatory and will travel via POV.   Final Clinical Impressions(s) / UC Diagnoses   Final diagnoses:  Atypical chest pain  Left upper quadrant abdominal pain     Discharge Instructions      Please go to the emergency department at [facility] for evaluation of your chest and upper abdomen pain as you require imaging that cannot be performed at the urgent care.  Now.     ED Prescriptions   None    PDMP not reviewed this encounter.   Becky Augusta, NP 09/20/23 [phone]

## 2023-09-20 NOTE — ED Notes (Signed)
Patient is being discharged from the Urgent Care and sent to the [facility] Emergency Department via private vehicle . Per private vehicle, patient is in need of higher level of care due to chest pain. Patient is aware and verbalizes understanding of plan of care.  Vitals:   09/20/23 0905  BP: (!) 158/86  Pulse: 87  Resp: 15  Temp: 98.6 F (37 C)  SpO2: 98%

## 2023-09-20 NOTE — Discharge Instructions (Signed)
Please go to the emergency department at [facility] for evaluation of your chest and upper abdomen pain as you require imaging that cannot be performed at the urgent care.  Now.

## 2023-10-28 ENCOUNTER — Ambulatory Visit
Admission: EM | Admit: 2023-10-28 | Discharge: 2023-10-28 | Disposition: A | Discharge status: Home / Self Care | Payer: BC Managed Care – PPO | Attending: Family Medicine | Admitting: Family Medicine

## 2023-10-28 ENCOUNTER — Ambulatory Visit: Payer: BC Managed Care – PPO

## 2023-10-28 DIAGNOSIS — R051 Acute cough: Secondary | ICD-10-CM | POA: Diagnosis not present

## 2023-10-28 DIAGNOSIS — J432 Centrilobular emphysema: Secondary | ICD-10-CM

## 2023-10-28 DIAGNOSIS — Z85118 Personal history of other malignant neoplasm of bronchus and lung: Secondary | ICD-10-CM

## 2023-10-28 MED ORDER — PROMETHAZINE-DM 6.25-15 MG/5ML PO SYRP: 5.0000 mL | ORAL_SOLUTION | Freq: Four times a day (QID) | ORAL | 0 refills | Status: AC | PRN

## 2023-10-28 MED ORDER — PREDNISONE 50 MG PO TABS: 50.0000 mg | ORAL_TABLET | Freq: Every day | ORAL | 0 refills | Status: AC

## 2023-10-28 MED ORDER — AZITHROMYCIN 250 MG PO TABS: ORAL_TABLET | ORAL | 0 refills | Status: AC

## 2023-10-28 NOTE — Discharge Instructions (Signed)
Your chest xray did not show evidence of pneumonia  though the radiologist has not yet read it. If they find something that I didn't, I will call you.    Stop by the pharmacy to pick up your prescriptions.  Follow up with your lung care provider.

## 2023-10-28 NOTE — ED Provider Notes (Signed)
[facility]    CSN: [id number] Arrival date & time: 10/28/23  1001      History   Chief Complaint Chief Complaint  Patient presents with   Cough    HPI Michele Dodson is a 62 y.o. female.   HPI  History obtained from the patient. [name] presents for cough for 2 weeks with rhinorrhea and congestion.  Took some Mucinex, Zyrtec, leftover Promethazine DM and NyQuil with minimal relief.  On Wednesday, started having right ear pressure however today her left ear is hurting as well.  Her husband has been sick too.  Denies fever, chills, shortness of breath, wheezing, nausea, vomiting, abdominal bleeding, diarrhea and sore throat.    Had a right upper lobe lobectomy.  Has CLL.  There is concern for bone cancer as well.      Past Medical History:  Diagnosis Date   Cancer (HCC) 07/2017   leukemia (CLL) tx through [facility]   Hypertension     Patient Active Problem List   Diagnosis Date Noted   Pneumonia involving right lung 09/07/2022   Tobacco use disorder 09/07/2022   MDD (major depressive disorder) 12/14/2018   GAD (generalized anxiety disorder) 12/14/2018   Glucose intolerance (impaired glucose tolerance) 09/06/2018   Seasonal allergies 09/06/2018   CLL (chronic lymphocytic leukemia) (HCC) 08/26/2017   SOB (shortness of breath) 08/07/2017   BMI 40.0-44.9, adult (HCC) 07/25/2017   GERD (gastroesophageal reflux disease) 09/20/2016   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      Home Medications    Prior to Admission medications   Medication Sig Start Date End Date Taking? Authorizing Provider  azithromycin (ZITHROMAX Z-PAK) 250 MG tablet Take 2 tablets on day 1 then 1 tablet daily 10/28/23  Yes Nixxon Faria, DO  promethazine-dextromethorphan (PROMETHAZINE-DM) 6.25-15 MG/5ML syrup  Take 5 mLs by mouth 4 (four) times daily as needed. 10/28/23  Yes Rheya Minogue, DO  albuterol (VENTOLIN HFA) 108 (90 Base) MCG/ACT inhaler Inhale 1-2 puffs into the lungs every 6 (six) hours as needed for wheezing or shortness of breath. 08/29/22   Eusebio Friendly B, PA-C  b complex vitamins capsule Take 1 capsule by mouth daily. 10/07/19   Melony Overly T, PA-C  calcium citrate-vitamin D (CITRACAL+D) 315-200 MG-UNIT tablet Take 1 tablet by mouth 2 (two) times daily.    [provider]  ELDERBERRY PO Take by mouth.    [provider]  losartan (COZAAR) 100 MG tablet losartan 100 mg tablet  TAKE ONE TABLET BY MOUTH EVERY DAY    [provider]  Multiple Vitamin (MULTIVITAMIN) tablet Take 1 tablet by mouth daily.    [provider]  zinc gluconate 50 MG tablet Take 50 mg by mouth daily.    [provider]    Family History Family History  Problem Relation Age of Onset   Melanoma Mother    Breast cancer Mother    Melanoma Father     Social History Social History   Tobacco Use   Smoking status: Some Days    Current packs/day: 0.20    Types: Cigarettes   Smokeless tobacco: Never   Tobacco comments:    not a lot of vaping  Vaping Use   Vaping status: Former  Substance Use Topics   Alcohol use: Never  Alcohol/week: 0.0 standard drinks of alcohol   Drug use: Never     Allergies   Codeine   Review of Systems Review of Systems: negative unless otherwise stated in HPI.      Physical Exam Triage Vital Signs ED Triage Vitals  Encounter Vitals Group     BP 10/28/23 1042 (!) 140/78     Systolic BP Percentile --      Diastolic BP Percentile --      Pulse Rate 10/28/23 1042 71     Resp 10/28/23 1042 18     Temp 10/28/23 1042 98.5 F (36.9 C)     Temp Source 10/28/23 1042 Oral     SpO2 10/28/23 1042 98 %     Weight 10/28/23 1043 235 lb (106.6 kg)     Height 10/28/23 1043 5\' 6"  (1.676 m)     Head Circumference --      Peak  Flow --      Pain Score 10/28/23 1043 1     Pain Loc --      Pain Education --      Exclude from Growth Chart --    No data found.  Updated Vital Signs BP (!) 140/78 (BP Location: Left Arm)   Pulse 71   Temp 98.5 F (36.9 C) (Oral)   Resp 18   Ht 5\' 6"  (1.676 m)   Wt 106.6 kg   LMP  (LMP Unknown)   SpO2 98%   BMI 37.93 kg/m   Visual Acuity Right Eye Distance:   Left Eye Distance:   Bilateral Distance:    Right Eye Near:   Left Eye Near:    Bilateral Near:     Physical Exam GEN:     alert, non-toxic appearing female in no distress    HENT:  mucus membranes moist, oropharyngeal without lesions or erythema, , clear nasal discharge,  RESP:  no increased work of breathing, frequent cough, coarse breath sounds with expiratory wheezing CVS:   regular rate and rhythm Skin:   warm and dry, no rash on visible skin    UC Treatments / Results  Labs (all labs ordered are listed, but only abnormal results are displayed) Labs Reviewed - No data to display   EKG   Radiology DG Chest 2 View  Result Date: 10/28/2023 CLINICAL DATA:  Cough for 5 days. EXAM: CHEST - 2 VIEW COMPARISON:  08/29/2022 FINDINGS: The heart size and mediastinal contours are within normal limits. Mild right lung scarring and surgical staples noted. Both lungs are otherwise clear. The visualized skeletal structures are unremarkable. IMPRESSION: Postop changes in right lung.  No active cardiopulmonary disease. Electronically Signed   By: Danae Orleans M.D.   On: 10/28/2023 15:54     Procedures Procedures (including critical care time)  Medications Ordered in UC Medications - No data to display  Initial Impression / Assessment and Plan / UC Course  I have reviewed the triage vital signs and the nursing notes.  Pertinent labs & imaging results that were available during my care of the patient were reviewed by me and considered in my medical decision making (see chart for details).       Pt is a 62  y.o. female with history of tobacco use, right lobectomy, CLL and concern for bone cancer who presents for 5 days of respiratory symptoms. Tressia is afebrile here without recent antipyretics. Satting well on room air. Overall pt is non-toxic appearing, well hydrated, without respiratory distress. Pulmonary exam is  remarkable diffuse coarse breath sounds with wheezing.  COVID, RSV and influenza panel recommended but patient declined.  Discussed necessity for chest x-ray and she is agreeable.  Chest xray personally reviewed by me without focal pneumonia, pleural effusion, cardiomegaly or pneumothorax. Patient aware the radiologist has not read her xray and is comfortable with the preliminary read by me. Will review radiologist read when available and call patient if a change in plan is warranted.  Pt agreeable to this plan prior to discharge.   Given her smoking history and the way that her lungs sound I suspect she may have a emphysema flareup.  On chart review she has history of mild centrilobular emphysema.  Treat with antibiotics and steroids as below.  Promethazine DM for cough  Return and ED precautions given and voiced understanding. Discussed MDM, treatment plan and plan for follow-up with patient who agrees with plan.   Radiologist impression reviewed.  Final Clinical Impressions(s) / UC Diagnoses   Final diagnoses:  History of lung cancer  Centrilobular emphysema (HCC)  Acute cough     Discharge Instructions      Your chest xray did not show evidence of pneumonia  though the radiologist has not yet read it. If they find something that I didn't, I will call you.    Stop by the pharmacy to pick up your prescriptions.  Follow up with your lung care provider.        ED Prescriptions     Medication Sig Dispense Auth. Provider   azithromycin (ZITHROMAX Z-PAK) 250 MG tablet Take 2 tablets on day 1 then 1 tablet daily 6 tablet Marie Chow, DO   predniSONE (DELTASONE) 50 MG  tablet Take 1 tablet (50 mg total) by mouth daily for 5 days. 5 tablet Mikyla Schachter, DO   promethazine-dextromethorphan (PROMETHAZINE-DM) 6.25-15 MG/5ML syrup Take 5 mLs by mouth 4 (four) times daily as needed. 118 mL Katha Cabal, DO      PDMP not reviewed this encounter.   Katha Cabal, DO 11/05/23 0501

## 2023-10-28 NOTE — ED Triage Notes (Signed)
Patient presents with her right ear stopped up, left ear is starting to hurt, cough and congestion. Symptoms started on Wednesday. Treated with Mucinex ER. Zyrtec with minimal relief. Sx are worse at night.

## 2024-04-20 ENCOUNTER — Ambulatory Visit: Admitting: Podiatry

## 2024-05-13 ENCOUNTER — Other Ambulatory Visit: Payer: Self-pay | Admitting: Internal Medicine

## 2024-05-13 DIAGNOSIS — Z1231 Encounter for screening mammogram for malignant neoplasm of breast: Secondary | ICD-10-CM

## 2024-05-28 ENCOUNTER — Ambulatory Visit
Admission: RE | Admit: 2024-05-28 | Discharge: 2024-05-28 | Disposition: A | Discharge status: Home / Self Care | Source: Ambulatory Visit | Attending: Internal Medicine | Admitting: Internal Medicine

## 2024-05-28 DIAGNOSIS — Z1231 Encounter for screening mammogram for malignant neoplasm of breast: Secondary | ICD-10-CM | POA: Insufficient documentation

## 2024-07-13 ENCOUNTER — Ambulatory Visit (INDEPENDENT_AMBULATORY_CARE_PROVIDER_SITE_OTHER)

## 2024-07-13 ENCOUNTER — Ambulatory Visit: Admitting: Podiatry

## 2024-07-13 DIAGNOSIS — M7661 Achilles tendinitis, right leg: Secondary | ICD-10-CM | POA: Diagnosis not present

## 2024-07-13 DIAGNOSIS — M7660 Achilles tendinitis, unspecified leg: Secondary | ICD-10-CM

## 2024-07-13 DIAGNOSIS — S92511A Displaced fracture of proximal phalanx of right lesser toe(s), initial encounter for closed fracture: Secondary | ICD-10-CM | POA: Diagnosis not present

## 2024-07-13 NOTE — Progress Notes (Signed)
 She presents today after having not seen her for a couple of years with a chief concern of pain to the lateral aspect of the right foot.  She states that she dropped a terra-cotta bird feeder on her fifth digit right foot and feels that this may be the reason that this has been hurting.  She has tightness in her Achilles bilaterally right greater than left  Objective: Vital signs are stable alert oriented x 3.  Pulses are palpable.  Mild pes planovalgus is noted with a palpable bursitis beneath the fifth metatarsal head of the right foot.  She also has pain on palpation of the base of the fifth proximal phalanx right foot.  Large retrocalcaneal spur noted right heel.  Minimal tenderness on palpation of the Achilles bilateral  Radiographs taken today demonstrate a fracture in the plantar condyle of the proximal phalanx.  Minimally dislocated.  Retrocalcaneal heel spur right.  Assessment: Bursitis subfifth met head with fracture proximal phalanx fifth digit right foot.  Possible Achilles tendinitis heel spur syndrome posterior.  Plan: Offered her an injection for the bursitis she declined I will follow-up with her on an as-needed basis.
